# Patient Record
Sex: Female | Born: 1950 | Race: White | Hispanic: No | Smoking: Never smoker
Health system: Southern US, Community
[De-identification: ages and names within clinical notes are randomized; demographics above are authoritative.]

## PROBLEM LIST (undated history)

## (undated) DIAGNOSIS — E079 Disorder of thyroid, unspecified: Secondary | ICD-10-CM

## (undated) DIAGNOSIS — F32A Depression, unspecified: Secondary | ICD-10-CM

## (undated) DIAGNOSIS — I10 Essential (primary) hypertension: Secondary | ICD-10-CM

## (undated) DIAGNOSIS — F329 Major depressive disorder, single episode, unspecified: Secondary | ICD-10-CM

## (undated) DIAGNOSIS — M1990 Unspecified osteoarthritis, unspecified site: Secondary | ICD-10-CM

## (undated) HISTORY — PX: KNEE SURGERY: SHX244

## (undated) HISTORY — PX: OTHER SURGICAL HISTORY: SHX169

---

## 2003-08-08 ENCOUNTER — Encounter: Payer: Self-pay | Admitting: Family Medicine

## 2003-08-08 ENCOUNTER — Ambulatory Visit (HOSPITAL_COMMUNITY): Admission: RE | Admit: 2003-08-08 | Discharge: 2003-08-08 | Payer: Self-pay | Admitting: Family Medicine

## 2003-12-14 ENCOUNTER — Ambulatory Visit (HOSPITAL_BASED_OUTPATIENT_CLINIC_OR_DEPARTMENT_OTHER): Admission: RE | Admit: 2003-12-14 | Discharge: 2003-12-14 | Payer: Self-pay | Admitting: Orthopedic Surgery

## 2003-12-14 ENCOUNTER — Ambulatory Visit (HOSPITAL_COMMUNITY): Admission: RE | Admit: 2003-12-14 | Discharge: 2003-12-14 | Payer: Self-pay | Admitting: Orthopedic Surgery

## 2005-01-30 ENCOUNTER — Ambulatory Visit: Payer: Self-pay | Admitting: Internal Medicine

## 2005-01-30 ENCOUNTER — Observation Stay (HOSPITAL_COMMUNITY): Admission: AD | Admit: 2005-01-30 | Discharge: 2005-01-31 | Payer: Self-pay | Admitting: Family Medicine

## 2005-02-03 ENCOUNTER — Inpatient Hospital Stay (HOSPITAL_COMMUNITY): Admission: EM | Admit: 2005-02-03 | Discharge: 2005-02-05 | Payer: Self-pay | Admitting: Emergency Medicine

## 2010-04-03 ENCOUNTER — Ambulatory Visit (HOSPITAL_COMMUNITY): Admission: RE | Admit: 2010-04-03 | Discharge: 2010-04-03 | Payer: Self-pay | Admitting: Family Medicine

## 2010-04-09 ENCOUNTER — Ambulatory Visit (HOSPITAL_COMMUNITY): Admission: RE | Admit: 2010-04-09 | Discharge: 2010-04-09 | Payer: Self-pay | Admitting: Family Medicine

## 2011-04-12 NOTE — Discharge Summary (Signed)
Latoya [name], [name]                ACCOUNT NO.:  [id number]   MEDICAL RECORD NO.:  [id number]          PATIENT TYPE:  INP   LOCATION:  A214                          FACILITY:  [facility]   PHYSICIAN:  Patrica Duel, M.D.    DATE OF BIRTH:  04/16/1951   DATE OF ADMISSION:  02/03/2005  DATE OF DISCHARGE:  03/14/2006LH                                 DISCHARGE SUMMARY   DISCHARGE DIAGNOSES:  1.  Recurrent hematochezia secondary to diverticular bleed (Indocin induced)      with secondary acute blood loss anemia.  2.  Hypothyroidism.  3.  Osteoarthritis.  4.  Gastroesophageal reflux disease.   For details regarding admission, please refer to the admitting note.  Briefly, this 60 year old female had been discharged from this facility  approximately 2 days prior to being readmitted with GI bleeding.  She did  have an episode of hematochezia.  No etiology was determined, and a  colonoscopy was declined.  She developed a cramping sensation and  hematochezia of a moderate amount at approximately 2 p.m. on the day of  admission.  She had approximately two to three before presenting to the  emergency department, where she had a fourth bloody stool.  Her initial  hemodynamics were reasonable, although she was mildly tachycardic with a  heart rate of 110.  She also had mild orthostasis.  Hemoglobin was 8.3 with  an MCV of 83 with a normal platelet count.   COURSE IN THE HOSPITAL:  The patient was admitted to 2-A.  She was typed and  crossed for 2 units of packed cells and remained stable.  A colonoscopy was  performed by Dr. Karilyn Cota which revealed the presence of a diverticular ulcer  with an ulcerated diverticulum.  She also had pancolonic diverticulosis.  Dr. Karilyn Cota felt this was most likely from Indocin (this had been  discontinued from her prior admission).   The patient remained stable and was discharged the following day in good  condition.   DISPOSITION AND MEDICATIONS:  Iron tablets,  Synthroid 175 daily, Protonix 40  daily, Cipro 500 b.i.d. 10 days, metronidazole 500 b.i.d. for 10 days.  She  will be followed and treated expectantly as an outpatient.      MC/MEDQ  D:  02/28/2005  T:  02/28/2005  Job:  [id number]

## 2011-04-12 NOTE — Consult Note (Signed)
NAMEWILMOTH, RASNIC                ACCOUNT NO.:  [id number]   MEDICAL RECORD NO.:  [id number]          PATIENT TYPE:  INP   LOCATION:  A214                          FACILITY:  [facility]   PHYSICIAN:  Stana Bunting, M.D.  DATE OF BIRTH:  [birth year]   DATE OF CONSULTATION:  DATE OF DISCHARGE:                                   CONSULTATION   REASON FOR CONSULTATION:  Further evaluation of hematochezia.   HISTORY OF PRESENT ILLNESS:  Ms. Rumpf is a 60 year old white female with a  past medical history significant for hypothyroidism, as well as  osteoarthritis, mainly involving her knees, as well as vitiligo.  She  recently was hospitalized here earlier this week for hematochezia.  During  the course of that hospitalization she was found to be anemic and did  undergo an upper endoscopy.  This was done after she was seen in  consultation by Dr. Karilyn Cota.  He performed an upper endoscopy on January 31, 2005, which showed the esophagus and stomach to be normal.  Likewise, the  duodenum was normal.  No lesion was seen to account for her GI bleeding.  Of  note, there was concern related to the fact that she had been taking Indocin  for several months for her osteoarthritis.  A colonoscopy was planned for  the next day but Ms. Corrie decided to leave against medical advice.  Of  note, during the course of that hospitalization she says she did not receive  any blood transfusions.   Ms. Brenes returns to the emergency department today noting that over the  course of this afternoon she has had three-four bowel movements containing  mainly clots, as well as bright red blood.  She denies any frank melena and  denies any abdominal pain.  Since her discharge until today she had not had  any bleeding whatsoever.  She has not had any abdominal pain, nausea and  vomiting.  She has been eating without any difficulty and denies any  dysphasia or reflux symptoms.  She has no longer been taking any NSAIDS.  Late  this afternoon she began feeling weak and subsequently came in for  further evaluation.  Of note, she says this is similar to the way she  presented last week.  Upon arrival to the emergency department the patient's  blood pressure is stable but she is slightly tachycardia at around 110.  She  denies any chest pain, shortness of breath, or syncope.  However, she does  have URI-type symptoms.   Over the course of evaluating Ms. Brentlinger I have reviewed the electronic  medical records here, including Dr. Patty Sermons note from earlier this week.   PAST MEDICAL HISTORY:  1.  Hypothyroidism.  2.  Osteoarthritis mainly involving her knees.  3.  Vitiligo.   PAST SURGICAL HISTORY:  None.   CURRENT MEDICATIONS:  1.  The patient notes she is taking Synthroid.  2.  She reports being off of indomethacin since her recent GI bleed.   ALLERGIES:  SULFA.   SOCIAL HISTORY:  The patient lives here in [place].  She is separated and  has two grown children.  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  She does not use alcohol or tobacco.   FAMILY HISTORY:  Negative for colon cancer or liver disease.  She does  report a family history of diabetes.   REVIEW OF SYSTEMS:  A complete review of systems was performed and is  negative except as noted in the history of present illness.   PHYSICAL EXAMINATION:  VITAL SIGNS:  Pulse 101, blood pressure 118/67,  respirations 22.  She is saturating 97% on room air.  GENERAL:  An obese white female who is alert and oriented.  She is nontoxic.  HEENT:  Atraumatic normocephalic.  Sclerae anicteric.  NECK:  Supple.  CARDIOVASCULAR:  Regular rate and rhythm.  CHEST:  Clear to auscultation bilaterally.  ABDOMEN:  Obese.  There is no guarding or rebound.  I do not appreciate any  masses or organomegaly.  She has positive bowel sounds.  EXTREMITIES:  Warm.  No edema.  NEUROLOGIC:  The patient is alert and oriented and has appropriate affect.    LABORATORY DATA:  Labs checked in the emergency department today show the  patient to have a PT of 12.6 with an INR of 1.9.  Her hemoglobin today is  8.3 with an MCV of 83.  Her platelet count is 297.  Her white count is 5.6.  A BMP shows a sodium of 138, a potassium slightly low at 3.3, CO2 is 27, BUN  12, creatinine 1.0.  Of note, for comparison when she left on January 31, 2005  her hemoglobin was 9.6.  On January 30, 2005 it was 9.9.  Endoscopic data is  reviewed in the history of present illness.   ASSESSMENT:  Ms. Bies is a 60 year old white female with a recent  gastrointestinal bleed.  She returns today with recurrent hematochezia.  She  has been investigated to date with an upper endoscopy, which did not show  any source of bleeding.  I suspect on the history that she most likely has a  lower gastrointestinal bleed, possibly a diverticular bleed.  Another  diagnostic consideration could be a possible small bowel lesion related to  her NSAID use.  Likewise, NSAID colonic injury could also cause her  bleeding.  At this point she is somewhat tachycardiac and has been typed and  crossed and will receive two units of packed red blood cells.  We will  follow her closely and begin to prep her for a colonoscopy in the morning.  I have discussed this with Ms. Longsworth and she understands the risks and  benefits of proceeding with this and wishes to proceed.  She will be placed  on a proton pump inhibitor as well.      RSS/MEDQ  D:  02/03/2005  T:  02/04/2005  Job:  [id number]

## 2011-04-12 NOTE — H&P (Signed)
NAMESINCERITY, CEDAR                ACCOUNT NO.:  [id number]   MEDICAL RECORD NO.:  [id number]          PATIENT TYPE:  INP   LOCATION:  A207                          FACILITY:  [facility]   PHYSICIAN:  Kirk Ruths, M.D.DATE OF BIRTH:  1951-11-01   DATE OF ADMISSION:  DATE OF DISCHARGE:  LH                                HISTORY & PHYSICAL   CHIEF COMPLAINT:  Rectal bleeding for three days.   HISTORY OF PRESENT ILLNESS:  Mrs. Latoya [name] presented to our office this  morning complaining of three days of rectal bleeding.  She describes this as  mixed with bright red and sometimes dark.  She has had more gas and when she  expels the gas, it is nothing but blood in the toilet. This has also been  associated with more weakness and palpitations and her heart racing  especially when she stands up.  She has been on Indocin off and on for about  six weeks for arthritic pains.  She stopped this three days ago when the  bleeding began.  She denies any abdominal pain.  No nausea, vomiting,  constipation or diarrhea is associated with this.  On review of systems she  denies any fever, but does complain of generalized weakness.  No headaches,  but some mild dizziness, especially when she stands quickly. No chest pain.  Positive palpitations as described above.  No shortness of breath.  No  urinary tract symptoms are reported.   PAST MEDICAL HISTORY:  1.  Positive for hypothyroidism.  2.  Depression.  3.  Hypertension.  Currently on no medication.  4.  History of arthritis of the knees bilaterally for which she consults      with an orthopedist.   FAMILY HISTORY:  On her mother's side, she does have some diabetes.  Heart  disease in her father.  She has one brother who also has thyroid problems  and one sister who is apparently well.  She has two children who are in good  health.   SOCIAL HISTORY:  She denies smoking, alcohol use or drug use.  She works at  [employer].   CURRENT  MEDICATIONS:  1.  Synthroid 175 mcg p.o. q.a.m.  2.  Indocin 25 gm t.i.d. p.r.n. up until January 28, 2005 at which time the      bleeding began.   ALLERGIES:  She is allergic to sulfa.   PHYSICAL EXAMINATION:  VITAL SIGNS:  BP 140/80, weight 218.  GENERAL:  She does not look severely ill and is not terribly pale.  HEENT:  PERRLA.  EOMI.  No icterus.  Conjunctivae appear normal.  NECK:  Supple without adenopathy.  HEART:  Regular rate and rhythm without murmur, gallop or rub.  LUNGS:  Clear to auscultation without crackles, wheezes, rales, or rhonchi.  ABDOMEN:  Bowel sounds are positive.  Soft, nontender.  No masses or  organomegaly are palpable.  EXTREMITIES:  No edema, cyanosis, or clubbing.  SKIN:  Without any visible rash, ecchymosis or petechiae.  RECTAL:  She has dried blood around the anus and normal tone  of the anal  sphincter.  She does have obvious jelly-like blood present on rectal  examination with positive guaiac.   ASSESSMENT:  1.  Rectal bleeding.  2.  Palpitations.  3.  Hypothyroidism.  4.  History of hypertension, currently on no medications.  5.  Osteoarthritis.  6.  History of depression.  7.  Recent nonsteroidal anti-inflammatory drug use for the arthritis.   PLAN:  1.  She is admitted to Dr. Regino Schultze for IV fluids and supportive care.  She      will be consulted by GI as soon as possible for work-up for the GI      bleed.  2.  Continue all current home medications.      WMM/MEDQ  D:  01/30/2005  T:  01/30/2005  Job:  [id number]

## 2011-04-12 NOTE — Op Note (Signed)
[name], COLLAR                ACCOUNT NO.:  [id number]   MEDICAL RECORD NO.:  [id number]          PATIENT TYPE:  INP   LOCATION:  A207                          FACILITY:  [facility]   PHYSICIAN:  Lionel December, M.D.    DATE OF BIRTH:  [birth year]   DATE OF PROCEDURE:  01/31/2005  DATE OF DISCHARGE:                                 OPERATIVE REPORT   PROCEDURES:  Esophagogastroduodenoscopy.   INDICATIONS:  Latoya [name] is a 60 year old Caucasian female who presents with GI  bleed.  She has been on chronic NSAID therapy for the last six weeks or so.  She has been on indomethacin.  The procedure risks were reviewed with the  patient.  Informed consent was obtained.   PREOPERATIVE MEDICATIONS:  1.  Cetacaine for pharyngeal topical anesthesia.  2.  Demerol 50 mg IV.  3.  Versed 13 mg IV.   FINDINGS:  Procedure performed in endoscopy suite.  The patient's vital  signs and O2 saturation were monitored during the procedure and remained  stable.  The patient was placed in the left lateral decubitus position.  The  Olympus video scope was passed through the oropharynx without any difficulty  into the esophagus.   Esophagus:  The mucosa of the esophagus was normal throughout.  The GE  junction was at 36 cm and the hiatus was at 38 cm. She had a small sliding  hiatal hernia.   Stomach:  It was empty and distended very well with insufflation.  The folds  in the proximal stomach were normal.  Examination of the mucosa was also  normal at the body, antrum, pyloric channel, as well as angularis, fundus  and cardia.   Duodenal bulbar mucosa was normal.  The scope was passed into the second  part of the duodenum where mucosa and folds were normal.  The endoscope was  withdrawn.  The patient tolerated the procedure well.   FINAL DIAGNOSIS:  Small sliding hiatal hernia, otherwise normal  esophagogastroduodenoscopy.  No lesion found to account for GI bleed.   RECOMMENDATIONS:  Total colonoscopy in the  a.m.  She will be prepped with  GoLYTELY this afternoon.  H&H will be repeated in the a.m.  We will also get  a baseline PT and PTT.  If she has evidence of bleeding today, would  consider checking her H&H this afternoon.      NR/MEDQ  D:  01/31/2005  T:  01/31/2005  Job:  [id number]   cc:   Kirk Ruths, M.D.  [address]  [place]  [place] [address]  Fax: [phone]

## 2011-04-12 NOTE — Consult Note (Signed)
NAMEHEDDA, Latoya [name]                ACCOUNT NO.:  [id number]   MEDICAL RECORD NO.:  [id number]          PATIENT TYPE:  INP   LOCATION:  A207                          FACILITY:  [facility]   PHYSICIAN:  Lionel December, M.D.    DATE OF BIRTH:  October 01, 1951   DATE OF CONSULTATION:  01/30/2005  DATE OF DISCHARGE:                                   CONSULTATION   REFERRING PHYSICIAN:  Kirk Ruths, M.D.   REASON FOR CONSULTATION:  Gastrointestinal bleed.   HISTORY OF PRESENT ILLNESS:  The patient is a 60 year old Caucasian female  who presents with a three-day history of hematochezia.  Last Monday, she  developed several episodes of dark blood with fresh blood and clot per  rectum.  She had a lot of gas with this.  She denies any actual stools  noted.  She did had a bowel movement, but had nothing but blood.  Denies any  abdominal pain, nausea, vomiting, constipation, diarrhea or heartburn.  She  has been on Indocin for about six weeks.  She has had some dizziness and  weakness, especially with standing.  She was a direct admit per Dr.  Edison Simon office today.  On admission, her hemoglobin was 9.9, hematocrit  28.4 and MCV 83.5.  Her BUN was 11 and creatinine 0.6.  She has had at least  seven episodes of bleeding.  She has not had any further bleeding since  admission.  On rectal examination at Dr. Edison Simon office, she had jelly-  like material on the glove.  She had dried blood around the anus.   MEDICATIONS PRIOR TO ADMISSION:  1.  Indocin 25 mg three to four times daily as needed for arthritic pain.      She averaged taking three to four daily, but most recently only one to      two daily.  She has been on the medicine for six weeks.  2.  Synthroid 175 mcg daily.  3.  Darvocet a half to one tablet p.r.n.   ALLERGIES:  SULFA causes rash.   PAST MEDICAL HISTORY:  1.  Hypothyroidism.  2.  Depression.  3.  Bilateral knee arthritis.  4.  Degenerative joint disease, status post  bilateral knee arthroscopies      previously.  5.  History of hypertension several years ago and has not been on any      medicine recently.   FAMILY HISTORY:  Negative for colorectal cancer, chronic GI illnesses or  inflammatory bowel disease.   SOCIAL HISTORY:  She is married, but has been separated for three years.  She has two children.  She has worked part time with housekeeping at  [employer].  Denies any tobacco, alcohol or drug use.   REVIEW OF SYSTEMS:  GASTROINTESTINAL:  See HPI.  CONSTITUTIONAL:  Denies  weight loss.  CARDIOPULMONARY:  Denies any chest pain or shortness of  breath.   PHYSICAL EXAMINATION:  VITAL SIGNS:  Temperature maximum 100.3 degrees,  current temperature 98.8 degrees, pulse 74, respirations 20, blood pressure  116/71.  WEIGHT:  211 pounds.  HEIGHT:  65  inches.  GENERAL APPEARANCE:  A pleasant, obese, Caucasian female in no acute  distress.  SKIN:  Warm and dry.  No jaundice.  HEENT:  The conjunctivae are pink.  The sclerae are nonicteric.  Oropharyngeal mucosa moist and pink.  No lesions, erythema or exudate.  NECK:  No lymphadenopathy or thyromegaly.  CHEST:  Lungs are clear to auscultation.  CARDIAC:  Regular rate and rhythm.  Normal S1 and S2.  No murmurs, rubs or  gallops.  ABDOMEN:  Positive bowel sounds.  Soft, nontender and nondistended.  No  organomegaly or masses.  EXTREMITIES:  No edema.   LABORATORIES:  As mentioned in the HPI.  In addition, white count 8700,  platelets 245,000 and MCV 83.3.  Sodium 136, potassium 3.6, BUN 11,  creatinine 0.6, glucose 101.  Total bilirubin 0.4, alkaline phosphatase 88,  AST 18, ALT 19, albumin 3.7.   IMPRESSION:  The patient is a 60 year old lady who presents with acute onset  of hematochezia with anemia in the setting of a six-week history of Indocin  use.  At this time, cannot rule out rapid transit upper GI bleed secondary  to peptic ulcer disease from NSAID use, however, she is  hemodynamically  stable.  Another possibility includes colitis related to Indocin use.   RECOMMENDATIONS:  1.  Will initially proceed with EGD in the morning.  If this is negative,      she will need to have a diagnostic colonoscopy.  If she does have an      ulcer, then she will at a minimum need to have a screening colonoscopy      at a later date.  Will follow up H&H in the morning.  Will begin IV      Protonix 40 mg daily, first dose now.  2.  Further recommendations to follow.      LL/MEDQ  D:  01/30/2005  T:  01/30/2005  Job:  [id number]

## 2011-04-12 NOTE — Op Note (Signed)
NAME:  Latoya [name], Latoya [name]                          ACCOUNT NO.:  [id number]   MEDICAL RECORD NO.:  [id number]                   PATIENT TYPE:  AMB   LOCATION:  DSC                                  FACILITY:  [facility]   PHYSICIAN:  Harvie Junior, M.D.                DATE OF BIRTH:  1950/12/23   DATE OF PROCEDURE:  12/14/2003  DATE OF DISCHARGE:                                 OPERATIVE REPORT   PREOPERATIVE DIAGNOSIS:  Bilateral degenerative joint disease with bilateral  medial meniscal tears.   POSTOPERATIVE DIAGNOSES:  1. Bilateral degenerative joint disease with bilateral medial meniscal     tears.  2. Chondromalacia of the patella bilaterally.   OPERATION PERFORMED:  1. Right knee arthroscopy with debridement of posterior horn medial     meniscus, debridement of chondromalacia in the medial compartment,     debridement of medial shelf plica.  2. Debridement of chondromalacia in the patellofemoral joint.  3. Left knee arthroscopy with debridement of medial meniscal tear,     debridement of chondromalacia in the medial compartment and debridement     of medial shelf plica.  4. Debridement of chondromalacia patella of the left knee.   SURGEON:  Harvie Junior, M.D.   ASSISTANT:  Marshia Ly, P.A.   ANESTHESIA:  General.   INDICATIONS FOR PROCEDURE:  Ms. Suchocki is a 60 year old female with a long  history of having significant bilateral degenerative joint disease.  She  ultimately is evaluated in the office and felt to have medial meniscal  tearing.  We tried injection therapy which worked initially but she began  having intermittent catching, locking, grabbing pain.  Because of this she  was brought to the operating room for operative knee arthroscopy bilateral.   DESCRIPTION OF PROCEDURE:  The patient was brought to the operating room and  after adequate anesthesia was obtained with a general anesthetic, the  patient was placed supine on the operating table.  The left leg was  prepped  and draped in the usual sterile fashion.  Following this, routine  arthroscopic examination of the right knee after prepping and draping both  legs.  Attention was turned to the right knee.  Routine arthroscopic  examination of the right knee revealed there was a posterior horn medial  meniscal tear.  This was debrided back to a smooth and stable rim with a  straight biting forceps, upbiting forceps and left hand side biting forceps.  A suction shaver was used to contour down the remaining meniscal rim.  Attention was then turned towards the medial femoral condyle where there was  a 2 x 2 cm area of exposed bone.  There was some shouldering lesions, but  not dramatic.  Attention was then turned to the tibial plateau where there  was again some shouldering lesions.  Attention was turned to the lateral  side which had slight chondromalacia but not dramatic.  Attention was turned  up to the patellofemoral compartment where there were some grade 2 changes  in the patellofemoral joint.  This was debrided.  A large medial shelf plica  was identified at this point and this was debrided with a suction shaver.  Six liters of normal saline irrigation was instilled in the knee for  flushing and irrigation of the knee and once this was completed, the patient  had the knee irrigated and suctioned dry.  Attention was then turned towards  the left knee.  The left knee underwent routine arthroscopic examination at  this point and was found to have significant posterior horn medial meniscal  tear.  This was debrided with a straight biting forceps, up biting forceps.  There was a 2 cm area of exposed bone in the left knee and this was debrided  to a smooth and stable rim.  There were some shouldering lesions of  articular cartilage on this left knee which were debrided.  Attention was  turned to the tibial plateau which had a slight area of grade 2 change and  this again was debrided.  Attention was  turned toward the lateral  compartment where the chondromalacia of the lateral compartment was  identified and minimally debrided.  The anterior cruciate was identified and  debrided.  The patellofemoral compartment was identified at this point and  this was debrided with a suction shaver back to a smooth and stable rim.  At  this point the knee was suctioned dry and 6 L of normal saline irrigation  was filled in the knee first and this was suctioned dry and the knee was  closed with a bandage.  The portals were closed with a bandage.  Sterile  compressive dressing was applied to both knees.  The patient was then  transferred to the recovery room where she was noted to be in satisfactory  condition.  The estimated blood loss for this procedure was none.                                               Harvie Junior, M.D.    Ranae Plumber  D:  12/14/2003  T:  12/15/2003  Job:  [id number]

## 2011-04-12 NOTE — Op Note (Signed)
Latoya [name], Latoya [name]                ACCOUNT NO.:  [id number]   MEDICAL RECORD NO.:  [id number]          PATIENT TYPE:  INP   LOCATION:  A214                          FACILITY:  [facility]   PHYSICIAN:  Lionel December, M.D.    DATE OF BIRTH:  1951-06-22   DATE OF PROCEDURE:  02/04/2005  DATE OF DISCHARGE:                                 OPERATIVE REPORT   PROCEDURE:  Total colonoscopy.   INDICATION:  Vivion is a 60 year old Caucasian female who was admitted last  week with GI bleed and anemia.  She had been on indomethacin.  She had  normal esophagogastroduodenoscopy performed on January 31, 2005.  The patient  was scheduled to undergo colonoscopy.  The following morning, however, she  left AMA.  Yesterday afternoon she began to pass bright red blood per rectum  and therefore was rehospitalized and is agreeable to further workup.   On admission her hemoglobin is 8.3 grams.  She has received two units of  PRBC and is up to 10.8 g.  The procedure risks were reviewed the patient,  informed consent was obtained.   PREMEDICATION:  Demerol 50 mg IV, Versed 6 mg IV in divided dose.   FINDINGS:  Procedure performed in endoscopy suite.  The patient's vital  signs and O2 saturation were monitored during procedure and remained stable.  The patient was placed in the left lateral recumbent position and rectal  examination performed.  No abnormality noted on external or digital exam.  The Olympus video scope was placed in the rectum and advanced under vision  in the sigmoid colon.  Preparation was  excellent.  There is no fresh or old  blood noted.  There was a single ulcerated diverticulum at sigmoid colon  without any active bleeding.  A few more scattered diverticula noted  throughout the colon, but none of these had stigmata of bleeding or  ulceration.  The scope was passed to the cecum, which was identified by  appendiceal orifice and ileocecal valve.  Pictures taken for the record.  Colonic mucosa was  examined for the second time on the way out without  additional findings.  Rectal mucosa was normal.  The scope was retroflexed  to examine anorectal junction, which was unremarkable.  The endoscope was  then withdrawn.  The patient tolerated the procedure well.   FINAL DIAGNOSES:  1.  Sigmoid diverticular bleed.  Single ulcerated diverticulum.  2.  Pancolonic diverticulosis. She has a few small scattered diverticula      throughout the colon.   RECOMMENDATIONS:  1.  Full liquids today and regular diet in a.m.  2.  A bulk-forming agent can be at the time of discharge.  3.  She will have H&H name.  4.  She should not take indomethacin any more.  If she has to go back on      NSAID, she could do so after two weeks.      NR/MEDQ  D:  02/04/2005  T:  02/04/2005  Job:  [id number]   cc:   Patrica Duel, M.D.  [address]  [place] [address]  Fax: [phone]

## 2011-04-12 NOTE — H&P (Signed)
[name], [name]                ACCOUNT NO.:  [id number]   MEDICAL RECORD NO.:  [id number]          PATIENT TYPE:  INP   LOCATION:  A214                          FACILITY:  [facility]   PHYSICIAN:  Patrica Duel, M.D.    DATE OF BIRTH:  28-Sep-1951   DATE OF ADMISSION:  02/03/2005  DATE OF DISCHARGE:  [facility]                                HISTORY & PHYSICAL   CHIEF COMPLAINT:  Rectal bleeding.   HISTORY OF PRESENT ILLNESS:  This is a 60 year old female discharged from  this facility approximately two days ago after having been admitted for GI  bleeding.  Apparently no etiology was determined, and she declined  colonoscopy.  Her last documented hemoglobin was approximately 9.6 on 7  March.  She has had no problems since discharge until approximately 2 p.m.  when she had a cramping sensation and hematochezia of a moderate amount. She  had two to three before presenting to the emergency department where she had  a fourth.  Her initial hemodynamics were reasonable, though she was mildly  tachycardic with a heart rate of 110.  There was mild orthostasis with  increased heart rate to 122, though blood pressure remains relatively stable  upon standing.   The patient had previously been on Indocin, though this has been  discontinued per her.  She has a history of hypothyroidism which has been  well compensated.   There is no history of nausea or vomiting, hematemesis, fever, chills, chest  pain, shortness of breath, or neurologic deficits.   The patient is admitted for recurrent apparent lower GI bleeding.   MEDICATIONS:  1.  Synthroid 175 mcg daily.  2.  Protonix 40 mg daily.   ALLERGIES:  SULFA.   PAST MEDICAL HISTORY:  As noted above.   FAMILY HISTORY:  No history of colorectal cancer documented.   REVIEW OF SYSTEMS:  Negative except as mentioned.   SOCIAL HISTORY:  Nonsmoker, nondrinker.   PHYSICAL EXAMINATION:  GENERAL:  Very pleasant female who appears her stated  age of 61.  VITAL SIGNS:  Currently blood pressure 127/70, heart rate 80 and regular,  respirations 16 and unlabored.  HEENT:  Mild pallor.  Normocephalic and atraumatic.  Pupils equal.  Ears,  nose, throat benign.  NECK:  Supple with no bruits or thyromegaly or lymphadenopathy.  LUNGS:  Clear.  CARDIOVASCULAR:  Heart sounds normal without murmurs, rubs, or gallops.  ABDOMEN:  Nontender, nondistended.  Bowel sounds intact.  RECTAL:  Per ED physician is strongly guaiac positive without masses.  EXTREMITIES:  No clubbing, cyanosis, or edema.  NEUROLOGIC:  Exam is nonfocal.   LABORATORY DATA AND OTHER STUDIES:  Pertinent labs as noted.  Also relevant  is normal pro time and platelet count.  Her potassium is 3.3.   ASSESSMENT:  Recurrent gastrointestinal bleeding in 60 year old female with  above history.  Most likely represents lower gastrointestinal bleed as it  has been bright red without clots on this occurrence.  Further diagnostics  definitely indicated to elucidate source of the bleeding.  Currently,  hemodynamics are stable.  She reports one  very small stool since arrival to  the floor.   PLAN:  1.  GI consult.  2.  Will inform surgery of the patient's predicament should acute      intervention be necessary.  3.  Will type, cross, and transfuse 2 units and have 2 additional units on      hold in the lab.  4.  She will be followed and treated expectantly.      MC/MEDQ  D:  02/03/2005  T:  02/03/2005  Job:  [id number]

## 2014-09-13 ENCOUNTER — Other Ambulatory Visit (HOSPITAL_COMMUNITY): Payer: Self-pay | Admitting: Physician Assistant

## 2014-09-13 ENCOUNTER — Ambulatory Visit (HOSPITAL_COMMUNITY)
Admission: RE | Admit: 2014-09-13 | Discharge: 2014-09-13 | Disposition: A | Discharge status: Home / Self Care | Payer: Medicare Other | Source: Ambulatory Visit | Attending: Physician Assistant | Admitting: Physician Assistant

## 2014-09-13 DIAGNOSIS — R05 Cough: Secondary | ICD-10-CM | POA: Diagnosis present

## 2014-09-13 DIAGNOSIS — R0602 Shortness of breath: Secondary | ICD-10-CM

## 2014-10-25 ENCOUNTER — Other Ambulatory Visit (HOSPITAL_COMMUNITY): Payer: Self-pay | Admitting: Physician Assistant

## 2014-10-25 ENCOUNTER — Ambulatory Visit (HOSPITAL_COMMUNITY)
Admission: RE | Admit: 2014-10-25 | Discharge: 2014-10-25 | Disposition: A | Discharge status: Home / Self Care | Payer: Medicare Other | Source: Ambulatory Visit | Attending: Physician Assistant | Admitting: Physician Assistant

## 2014-10-25 DIAGNOSIS — J9811 Atelectasis: Secondary | ICD-10-CM | POA: Diagnosis not present

## 2014-10-25 DIAGNOSIS — R0602 Shortness of breath: Secondary | ICD-10-CM

## 2014-10-25 DIAGNOSIS — Q791 Other congenital malformations of diaphragm: Secondary | ICD-10-CM | POA: Insufficient documentation

## 2014-12-08 ENCOUNTER — Other Ambulatory Visit (HOSPITAL_COMMUNITY): Payer: Self-pay | Admitting: Respiratory Therapy

## 2014-12-08 DIAGNOSIS — R0602 Shortness of breath: Secondary | ICD-10-CM

## 2014-12-12 ENCOUNTER — Ambulatory Visit (HOSPITAL_COMMUNITY)
Admission: RE | Admit: 2014-12-12 | Discharge: 2014-12-12 | Disposition: A | Discharge status: Home / Self Care | Payer: Medicare Other | Source: Ambulatory Visit | Attending: Pulmonary Disease | Admitting: Pulmonary Disease

## 2014-12-12 DIAGNOSIS — I517 Cardiomegaly: Secondary | ICD-10-CM

## 2014-12-12 DIAGNOSIS — R0600 Dyspnea, unspecified: Secondary | ICD-10-CM | POA: Insufficient documentation

## 2014-12-12 NOTE — Progress Notes (Signed)
  Echocardiogram 2D Echocardiogram has been performed.  Latoya [name] 12/12/2014, 2:39 PM

## 2014-12-16 ENCOUNTER — Encounter (HOSPITAL_COMMUNITY): Payer: Medicare Other

## 2014-12-23 ENCOUNTER — Ambulatory Visit (HOSPITAL_COMMUNITY)
Admission: RE | Admit: 2014-12-23 | Discharge: 2014-12-23 | Disposition: A | Discharge status: Home / Self Care | Payer: Medicare Other | Source: Ambulatory Visit | Attending: Pulmonary Disease | Admitting: Pulmonary Disease

## 2014-12-23 DIAGNOSIS — R0602 Shortness of breath: Secondary | ICD-10-CM | POA: Diagnosis present

## 2014-12-23 LAB — BLOOD GAS, ARTERIAL
Acid-Base Excess: 1.8 mmol/L (ref 0.0–2.0)
BICARBONATE: 25.7 meq/L — AB (ref 20.0–24.0)
DRAWN BY: 25955
O2 SAT: 93.6 %
PH ART: 7.432 (ref 7.350–7.450)
PO2 ART: 76.6 mmHg — AB (ref 80.0–100.0)
Patient temperature: 37
TCO2: 22.5 mmol/L (ref 0–100)
pCO2 arterial: 39.2 mmHg (ref 35.0–45.0)

## 2014-12-23 MED ORDER — ALBUTEROL SULFATE (2.5 MG/3ML) 0.083% IN NEBU
2.5000 mg | INHALATION_SOLUTION | Freq: Once | RESPIRATORY_TRACT | Status: AC
  Administered 2014-12-23: 2.5 mg via RESPIRATORY_TRACT

## 2014-12-27 LAB — PULMONARY FUNCTION TEST
DL/VA % PRED: 150 %
DL/VA: 7.41 ml/min/mmHg/L
DLCO unc % pred: 77 %
DLCO unc: 19.75 ml/min/mmHg
FEF 25-75 PRE: 1.28 L/s
FEF 25-75 Post: 1.49 L/sec
FEF2575-%CHANGE-POST: 15 %
FEF2575-%PRED-POST: 64 %
FEF2575-%PRED-PRE: 55 %
FEV1-%CHANGE-POST: 2 %
FEV1-%Pred-Post: 59 %
FEV1-%Pred-Pre: 57 %
FEV1-PRE: 1.5 L
FEV1-Post: 1.54 L
FEV1FVC-%Change-Post: 1 %
FEV1FVC-%Pred-Pre: 101 %
FEV6-%Change-Post: 1 %
FEV6-%PRED-POST: 58 %
FEV6-%Pred-Pre: 58 %
FEV6-Post: 1.91 L
FEV6-Pre: 1.89 L
FEV6FVC-%Pred-Post: 104 %
FEV6FVC-%Pred-Pre: 104 %
FVC-%Change-Post: 1 %
FVC-%PRED-POST: 56 %
FVC-%Pred-Pre: 55 %
FVC-POST: 1.91 L
FVC-Pre: 1.89 L
POST FEV1/FVC RATIO: 81 %
Post FEV6/FVC ratio: 100 %
Pre FEV1/FVC ratio: 79 %
Pre FEV6/FVC Ratio: 100 %
RV % PRED: 119 %
RV: 2.49 L
TLC % pred: 78 %
TLC: 4.07 L

## 2014-12-28 IMAGING — CR DG CHEST 2V
2 series · 2 of 2 positions shown · non-contrast
Comparison: 09/13/2014

CLINICAL DATA: Short of breath for 6 weeks

EXAM:
CHEST  2 VIEW

[view not recorded (1 of 2)]
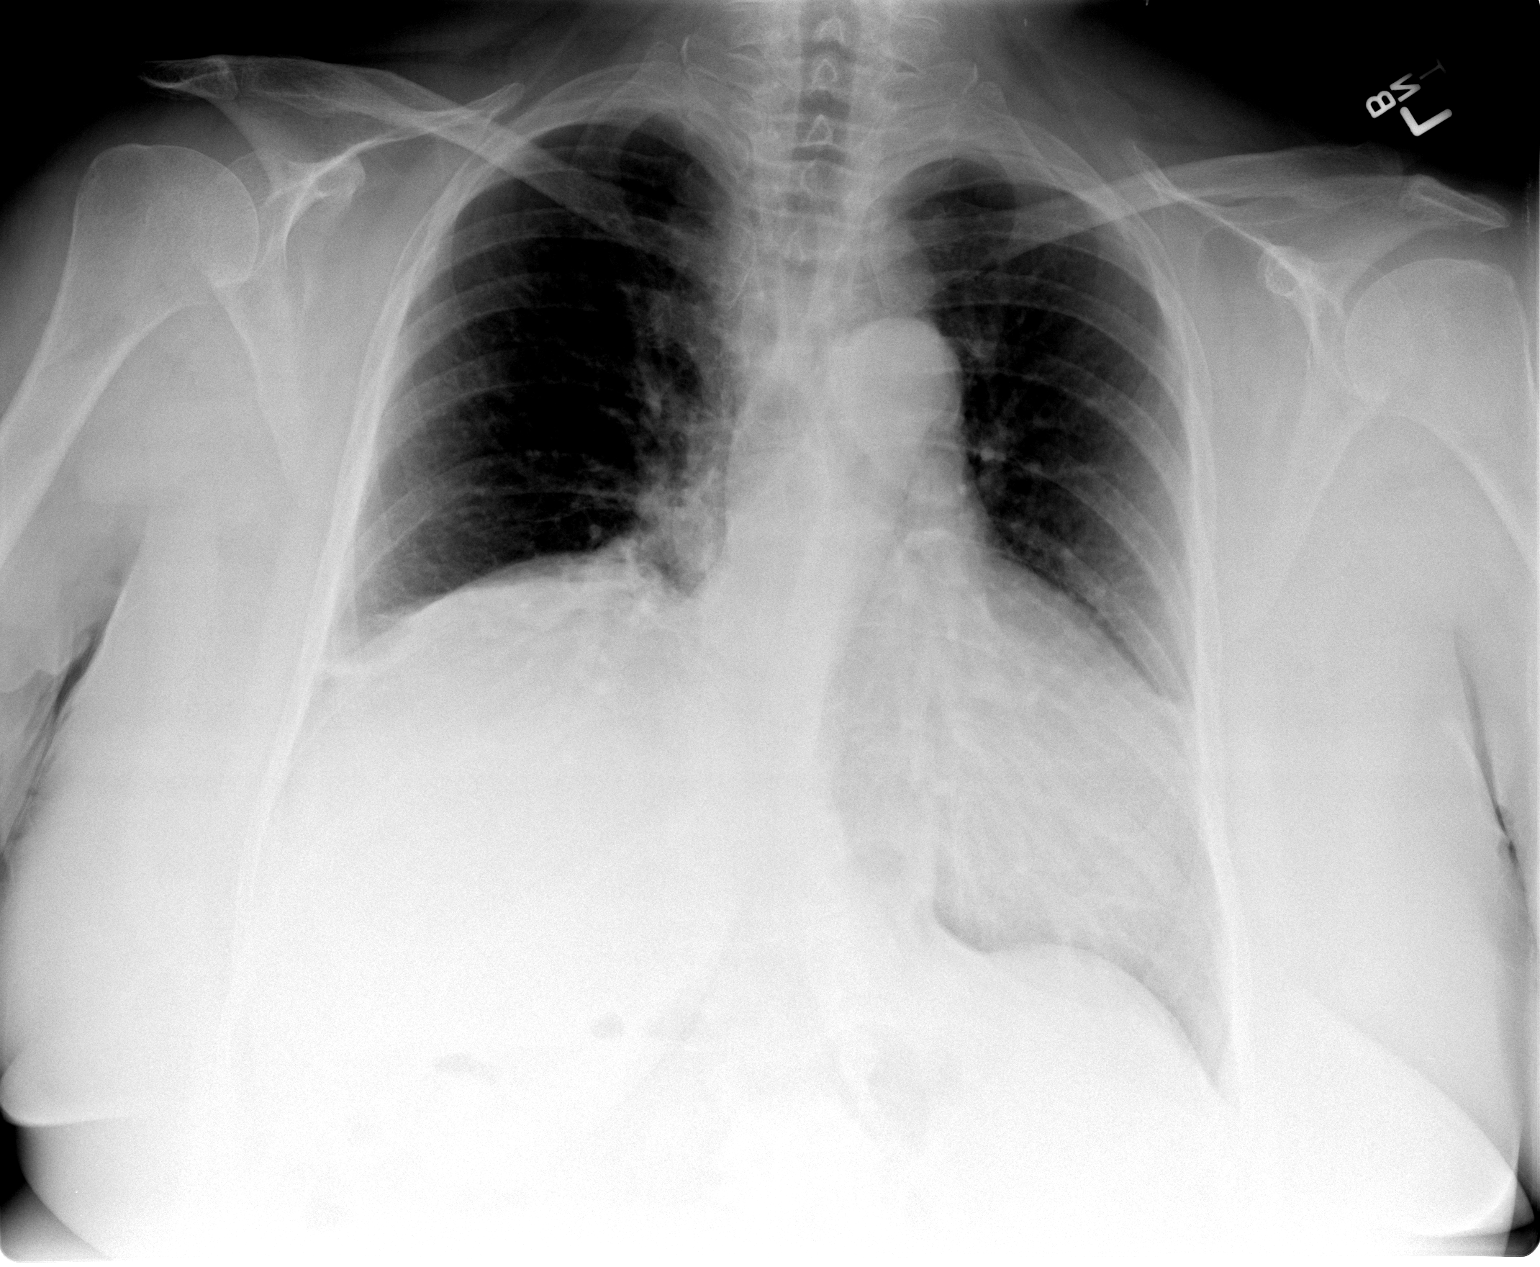

[view not recorded (2 of 2)]
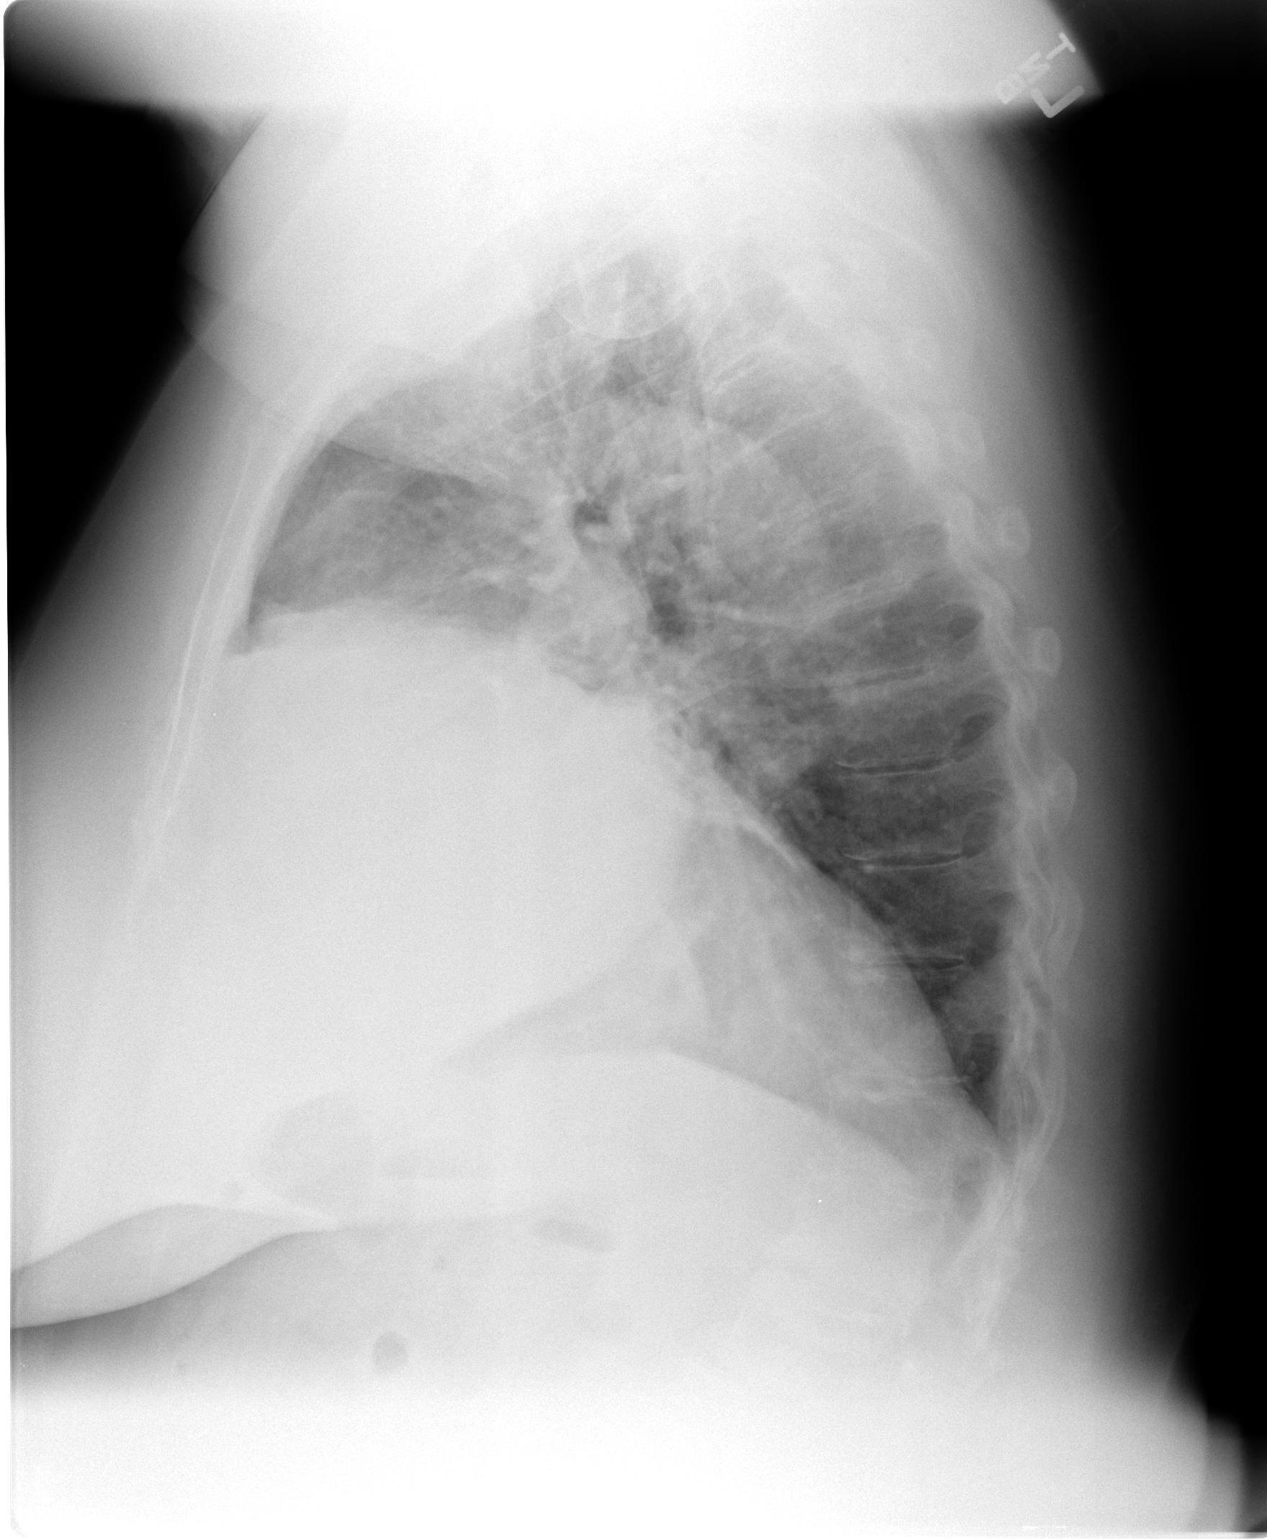

[2 of 2 positions shown; findings below may reference images not displayed]

FINDINGS: Right hemidiaphragm remains chronically elevated. The heart is
moderately enlarged. Right basilar atelectasis is stable. Minimal
atelectasis at the left base. No pneumothorax. No pleural effusion.
IMPRESSION: Chronic elevation of the right hemidiaphragm with basilar
atelectasis.

## 2016-02-14 DIAGNOSIS — M1991 Primary osteoarthritis, unspecified site: Secondary | ICD-10-CM | POA: Diagnosis not present

## 2016-02-14 DIAGNOSIS — Z6841 Body Mass Index (BMI) 40.0 and over, adult: Secondary | ICD-10-CM | POA: Diagnosis not present

## 2016-02-14 DIAGNOSIS — Z1389 Encounter for screening for other disorder: Secondary | ICD-10-CM | POA: Diagnosis not present

## 2016-12-25 DIAGNOSIS — E782 Mixed hyperlipidemia: Secondary | ICD-10-CM | POA: Diagnosis not present

## 2016-12-25 DIAGNOSIS — Z6841 Body Mass Index (BMI) 40.0 and over, adult: Secondary | ICD-10-CM | POA: Diagnosis not present

## 2016-12-25 DIAGNOSIS — E039 Hypothyroidism, unspecified: Secondary | ICD-10-CM | POA: Diagnosis not present

## 2016-12-25 DIAGNOSIS — Z1389 Encounter for screening for other disorder: Secondary | ICD-10-CM | POA: Diagnosis not present

## 2016-12-25 DIAGNOSIS — I10 Essential (primary) hypertension: Secondary | ICD-10-CM | POA: Diagnosis not present

## 2017-06-30 DIAGNOSIS — E063 Autoimmune thyroiditis: Secondary | ICD-10-CM | POA: Diagnosis not present

## 2017-06-30 DIAGNOSIS — Z1389 Encounter for screening for other disorder: Secondary | ICD-10-CM | POA: Diagnosis not present

## 2017-06-30 DIAGNOSIS — R0789 Other chest pain: Secondary | ICD-10-CM | POA: Diagnosis not present

## 2017-06-30 DIAGNOSIS — Z6841 Body Mass Index (BMI) 40.0 and over, adult: Secondary | ICD-10-CM | POA: Diagnosis not present

## 2017-06-30 DIAGNOSIS — R6884 Jaw pain: Secondary | ICD-10-CM | POA: Diagnosis not present

## 2017-06-30 DIAGNOSIS — R252 Cramp and spasm: Secondary | ICD-10-CM | POA: Diagnosis not present

## 2017-07-17 ENCOUNTER — Encounter: Payer: Self-pay | Admitting: Cardiology

## 2017-07-30 ENCOUNTER — Encounter: Payer: Self-pay | Admitting: Cardiology

## 2017-11-19 DIAGNOSIS — E063 Autoimmune thyroiditis: Secondary | ICD-10-CM | POA: Diagnosis not present

## 2017-11-19 DIAGNOSIS — Z6841 Body Mass Index (BMI) 40.0 and over, adult: Secondary | ICD-10-CM | POA: Diagnosis not present

## 2017-11-19 DIAGNOSIS — F329 Major depressive disorder, single episode, unspecified: Secondary | ICD-10-CM | POA: Diagnosis not present

## 2017-11-19 DIAGNOSIS — K219 Gastro-esophageal reflux disease without esophagitis: Secondary | ICD-10-CM | POA: Diagnosis not present

## 2017-11-19 DIAGNOSIS — I10 Essential (primary) hypertension: Secondary | ICD-10-CM | POA: Diagnosis not present

## 2017-11-19 DIAGNOSIS — M1991 Primary osteoarthritis, unspecified site: Secondary | ICD-10-CM | POA: Diagnosis not present

## 2018-01-19 DIAGNOSIS — Z6841 Body Mass Index (BMI) 40.0 and over, adult: Secondary | ICD-10-CM | POA: Diagnosis not present

## 2018-01-19 DIAGNOSIS — E782 Mixed hyperlipidemia: Secondary | ICD-10-CM | POA: Diagnosis not present

## 2018-01-19 DIAGNOSIS — I10 Essential (primary) hypertension: Secondary | ICD-10-CM | POA: Diagnosis not present

## 2018-05-15 ENCOUNTER — Observation Stay (HOSPITAL_COMMUNITY)
Admission: EM | Admit: 2018-05-15 | Discharge: 2018-05-16 | Disposition: A | Discharge status: Home / Self Care | Payer: Medicare Other | Attending: Internal Medicine | Admitting: Internal Medicine

## 2018-05-15 ENCOUNTER — Emergency Department (HOSPITAL_COMMUNITY): Payer: Medicare Other

## 2018-05-15 ENCOUNTER — Encounter (HOSPITAL_COMMUNITY): Payer: Self-pay | Admitting: Emergency Medicine

## 2018-05-15 ENCOUNTER — Other Ambulatory Visit: Payer: Self-pay

## 2018-05-15 DIAGNOSIS — R0789 Other chest pain: Principal | ICD-10-CM

## 2018-05-15 DIAGNOSIS — Z9889 Other specified postprocedural states: Secondary | ICD-10-CM | POA: Diagnosis not present

## 2018-05-15 DIAGNOSIS — Z882 Allergy status to sulfonamides status: Secondary | ICD-10-CM | POA: Insufficient documentation

## 2018-05-15 DIAGNOSIS — J986 Disorders of diaphragm: Secondary | ICD-10-CM | POA: Insufficient documentation

## 2018-05-15 DIAGNOSIS — E669 Obesity, unspecified: Secondary | ICD-10-CM | POA: Insufficient documentation

## 2018-05-15 DIAGNOSIS — Z8249 Family history of ischemic heart disease and other diseases of the circulatory system: Secondary | ICD-10-CM | POA: Diagnosis not present

## 2018-05-15 DIAGNOSIS — I10 Essential (primary) hypertension: Secondary | ICD-10-CM | POA: Diagnosis present

## 2018-05-15 DIAGNOSIS — F329 Major depressive disorder, single episode, unspecified: Secondary | ICD-10-CM

## 2018-05-15 DIAGNOSIS — E039 Hypothyroidism, unspecified: Secondary | ICD-10-CM | POA: Diagnosis not present

## 2018-05-15 DIAGNOSIS — E079 Disorder of thyroid, unspecified: Secondary | ICD-10-CM

## 2018-05-15 DIAGNOSIS — Z6841 Body Mass Index (BMI) 40.0 and over, adult: Secondary | ICD-10-CM | POA: Diagnosis not present

## 2018-05-15 DIAGNOSIS — I209 Angina pectoris, unspecified: Secondary | ICD-10-CM | POA: Diagnosis not present

## 2018-05-15 DIAGNOSIS — Z6839 Body mass index (BMI) 39.0-39.9, adult: Secondary | ICD-10-CM | POA: Diagnosis not present

## 2018-05-15 DIAGNOSIS — Z79899 Other long term (current) drug therapy: Secondary | ICD-10-CM | POA: Insufficient documentation

## 2018-05-15 DIAGNOSIS — Z1389 Encounter for screening for other disorder: Secondary | ICD-10-CM | POA: Diagnosis not present

## 2018-05-15 DIAGNOSIS — R079 Chest pain, unspecified: Secondary | ICD-10-CM | POA: Diagnosis not present

## 2018-05-15 DIAGNOSIS — R000 Tachycardia, unspecified: Secondary | ICD-10-CM | POA: Diagnosis not present

## 2018-05-15 DIAGNOSIS — Z79890 Hormone replacement therapy (postmenopausal): Secondary | ICD-10-CM | POA: Diagnosis not present

## 2018-05-15 DIAGNOSIS — R0602 Shortness of breath: Secondary | ICD-10-CM | POA: Diagnosis not present

## 2018-05-15 DIAGNOSIS — J9811 Atelectasis: Secondary | ICD-10-CM | POA: Diagnosis not present

## 2018-05-15 DIAGNOSIS — F32A Depression, unspecified: Secondary | ICD-10-CM

## 2018-05-15 DIAGNOSIS — M1990 Unspecified osteoarthritis, unspecified site: Secondary | ICD-10-CM | POA: Diagnosis not present

## 2018-05-15 DIAGNOSIS — R0689 Other abnormalities of breathing: Secondary | ICD-10-CM | POA: Diagnosis not present

## 2018-05-15 HISTORY — DX: Unspecified osteoarthritis, unspecified site: M19.90

## 2018-05-15 HISTORY — DX: Depression, unspecified: F32.A

## 2018-05-15 HISTORY — DX: Disorder of thyroid, unspecified: E07.9

## 2018-05-15 HISTORY — DX: Essential (primary) hypertension: I10

## 2018-05-15 HISTORY — DX: Major depressive disorder, single episode, unspecified: F32.9

## 2018-05-15 LAB — CBC
HEMATOCRIT: 38.5 % (ref 36.0–46.0)
Hemoglobin: 12.7 g/dL (ref 12.0–15.0)
MCH: 29.5 pg (ref 26.0–34.0)
MCHC: 33 g/dL (ref 30.0–36.0)
MCV: 89.3 fL (ref 78.0–100.0)
Platelets: 244 10*3/uL (ref 150–400)
RBC: 4.31 MIL/uL (ref 3.87–5.11)
RDW: 13.3 % (ref 11.5–15.5)
WBC: 7.8 10*3/uL (ref 4.0–10.5)

## 2018-05-15 LAB — BASIC METABOLIC PANEL
Anion gap: 10 (ref 5–15)
BUN: 31 mg/dL — ABNORMAL HIGH (ref 6–20)
CHLORIDE: 101 mmol/L (ref 101–111)
CO2: 26 mmol/L (ref 22–32)
Calcium: 9.2 mg/dL (ref 8.9–10.3)
Creatinine, Ser: 0.9 mg/dL (ref 0.44–1.00)
GFR calc non Af Amer: >60 mL/min (ref >60)
Glucose, Bld: 115 mg/dL — ABNORMAL HIGH (ref 65–99)
POTASSIUM: 4 mmol/L (ref 3.5–5.1)
SODIUM: 137 mmol/L (ref 135–145)

## 2018-05-15 LAB — TROPONIN I
Troponin I: <0.03 ng/mL (ref <0.03)
Troponin I: <0.03 ng/mL (ref <0.03)

## 2018-05-15 MED ORDER — ASPIRIN EC 325 MG PO TBEC
325.0000 mg | DELAYED_RELEASE_TABLET | Freq: Every day | ORAL | Status: DC
  Administered 2018-05-16: 325 mg via ORAL
  Filled 2018-05-15: qty 1

## 2018-05-15 MED ORDER — ZOLPIDEM TARTRATE 5 MG PO TABS: 5.0000 mg | ORAL_TABLET | Freq: Every evening | ORAL | Status: DC | PRN

## 2018-05-15 MED ORDER — LEVOTHYROXINE SODIUM 75 MCG PO TABS
150.0000 ug | ORAL_TABLET | Freq: Every day | ORAL | Status: DC
  Administered 2018-05-16: 150 ug via ORAL
  Filled 2018-05-15: qty 2

## 2018-05-15 MED ORDER — ACETAMINOPHEN 325 MG PO TABS: 650.0000 mg | ORAL_TABLET | ORAL | Status: DC | PRN

## 2018-05-15 MED ORDER — ATENOLOL 100 MG PO TABS
100.0000 mg | ORAL_TABLET | Freq: Every day | ORAL | Status: DC
  Filled 2018-05-15: qty 1

## 2018-05-15 MED ORDER — DULOXETINE HCL 60 MG PO CPEP
60.0000 mg | ORAL_CAPSULE | Freq: Every day | ORAL | Status: DC
  Filled 2018-05-15: qty 1

## 2018-05-15 MED ORDER — GI COCKTAIL ~~LOC~~: 30.0000 mL | Freq: Four times a day (QID) | ORAL | Status: DC | PRN

## 2018-05-15 MED ORDER — ENOXAPARIN SODIUM 40 MG/0.4ML ~~LOC~~ SOLN
40.0000 mg | Freq: Every day | SUBCUTANEOUS | Status: DC
  Filled 2018-05-15 (×2): qty 0.4

## 2018-05-15 MED ORDER — ONDANSETRON HCL 4 MG/2ML IJ SOLN
4.0000 mg | Freq: Four times a day (QID) | INTRAMUSCULAR | Status: DC | PRN
Start: 2018-05-15 — End: 2018-05-16

## 2018-05-15 MED ORDER — PANTOPRAZOLE SODIUM 40 MG PO TBEC
80.0000 mg | DELAYED_RELEASE_TABLET | Freq: Every day | ORAL | Status: DC
  Administered 2018-05-16: 80 mg via ORAL
  Filled 2018-05-15: qty 2

## 2018-05-15 MED ORDER — HYDROCHLOROTHIAZIDE 25 MG PO TABS
25.0000 mg | ORAL_TABLET | Freq: Every day | ORAL | Status: DC
  Filled 2018-05-15: qty 1

## 2018-05-15 MED ORDER — NABUMETONE 500 MG PO TABS
1000.0000 mg | ORAL_TABLET | Freq: Two times a day (BID) | ORAL | Status: DC
  Administered 2018-05-16: 1000 mg via ORAL
  Filled 2018-05-15 (×2): qty 2

## 2018-05-15 MED ORDER — LISINOPRIL 20 MG PO TABS
20.0000 mg | ORAL_TABLET | Freq: Every day | ORAL | Status: DC
  Filled 2018-05-15: qty 1

## 2018-05-15 NOTE — H&P (Signed)
History and Physical  Latoya BannerDonna R Crissman ZOX:[id number] DOB: 12-May-1951 DOA: 05/15/2018  Referring physician: Dr Clayborne DanaMesner, ED physician PCP: Shawnie DapperMann, Benjamin L, PA-C  Outpatient Specialists:  Patient Coming From: home  Chief Complaint: intermittent chest pain  HPI: Latoya [name] is a 67 y.o. female with a history of hypothyroidism, CHTN, arthritis, depression.  Patient reports 2 months of intermittent chest pain that starts in the left substernal area and radiates into the jaw and down her arms bilaterally.  Her pains are becoming more frequent and happened 3 times this week.  The chest pain is varying in intensity chest pain occurs when she is active and when she is bending over.  She went to her PCPs office today for evaluation and was promptly sent here.  Pain is self-limiting and last a few minutes.  No relieving or provoking factors.  Cardiac risk factors: Obesity, CHTN, History of coronary artery disease  Emergency Department Course: First troponin negative.  Review of Systems:   Pt denies any fevers, chills, nausea, vomiting, diarrhea, constipation, abdominal pain, shortness of breath, dyspnea on exertion, orthopnea, cough, wheezing, palpitations, headache, vision changes, lightheadedness, dizziness, melena, rectal bleeding.  Review of systems are otherwise negative  Past Medical History:  Diagnosis Date  . Arthritis   . Depression   . Hypertension   . Thyroid disease    Past Surgical History:  Procedure Laterality Date  . cyst removed from neck    . KNEE SURGERY Bilateral    Social History:  reports that she has never smoked. She has never used smokeless tobacco. She reports that she does not drink alcohol or use drugs. Patient lives at home  Allergies  Allergen Reactions  . Sulfa Antibiotics     No family history on file.  Family history of coronary artery disease: Her brother had a fairly significant MI in his early 6060s.  Her dad had multiple stenting and CABG in his 3180s.   Cardiac risk factors: Hypertension,  Prior to Admission medications   Medication Sig Start Date End Date Taking? Authorizing Provider  atenolol (TENORMIN) 100 MG tablet Take 1 tablet by mouth daily.    Yes [provider]  DULoxetine (CYMBALTA) 60 MG capsule Take 60 mg by mouth daily.   Yes [provider]  hydrochlorothiazide (HYDRODIURIL) 25 MG tablet Take 1 tablet by mouth daily.    Yes [provider]  levothyroxine (SYNTHROID, LEVOTHROID) 150 MCG tablet Take 150 mcg by mouth daily before breakfast.   Yes [provider]  lisinopril (PRINIVIL,ZESTRIL) 20 MG tablet Take 20 mg by mouth daily.    Yes [provider]  nabumetone (RELAFEN) 500 MG tablet Take 1,000 mg by mouth 2 (two) times daily.   Yes [provider]  omeprazole (PRILOSEC) 40 MG capsule Take 40 mg by mouth daily.   Yes [provider]    Physical Exam: BP 119/77 (BP Location: Left Arm)   Pulse 72   Temp 98.9 F (37.2 C) (Oral)   Resp 19   Ht 5\' 5"  (1.651 m)   Wt 108 kg (238 lb)   SpO2 99%   BMI 39.61 kg/m   . General: Elderly Caucasian female. Awake and alert and oriented x3. No acute cardiopulmonary distress.  Marland Kitchen. HEENT: Normocephalic atraumatic.  Right and left ears normal in appearance.  Pupils equal, round, reactive to light. Extraocular muscles are intact. Sclerae anicteric and noninjected.  Moist mucosal membranes. No mucosal lesions.  . Neck: Neck supple without lymphadenopathy. No carotid  bruits. No masses palpated.  . Cardiovascular: Regular rate with normal S1-S2 sounds. No murmurs, rubs, gallops auscultated. No JVD.  Marland Kitchen Respiratory: Good respiratory effort with no wheezes, rales, rhonchi. Lungs clear to auscultation bilaterally.  No accessory muscle use. . Abdomen: Soft, nontender, nondistended. Active bowel sounds. No masses or hepatosplenomegaly  . Skin: No rashes, lesions, or ulcerations.  Dry, warm to touch. 2+ dorsalis pedis and radial  pulses. . Musculoskeletal: No calf or leg pain. All major joints not erythematous nontender.  No upper or lower joint deformation.  Good ROM.  No contractures  . Psychiatric: Intact judgment and insight. Pleasant and cooperative. . Neurologic: No focal neurological deficits. Strength is 5/5 and symmetric in upper and lower extremities.  Cranial nerves II through XII are grossly intact.           Labs on Admission: I have personally reviewed following labs and imaging studies  CBC: Recent Labs  Lab 05/15/18 1607  WBC 7.8  HGB 12.7  HCT 38.5  MCV 89.3  PLT 244   Basic Metabolic Panel: Recent Labs  Lab 05/15/18 1607  NA 137  K 4.0  CL 101  CO2 26  GLUCOSE 115*  BUN 31*  CREATININE 0.90  CALCIUM 9.2   GFR: Estimated Creatinine Clearance: 76.1 mL/min (by C-G formula based on SCr of 0.9 mg/dL). Liver Function Tests: No results for input(s): AST, ALT, ALKPHOS, BILITOT, PROT, ALBUMIN in the last 168 hours. No results for input(s): LIPASE, AMYLASE in the last 168 hours. No results for input(s): AMMONIA in the last 168 hours. Coagulation Profile: No results for input(s): INR, PROTIME in the last 168 hours. Cardiac Enzymes: Recent Labs  Lab 05/15/18 1607  TROPONINI <0.03   BNP (last 3 results) No results for input(s): PROBNP in the last 8760 hours. HbA1C: No results for input(s): HGBA1C in the last 72 hours. CBG: No results for input(s): GLUCAP in the last 168 hours. Lipid Profile: No results for input(s): CHOL, HDL, LDLCALC, TRIG, CHOLHDL, LDLDIRECT in the last 72 hours. Thyroid Function Tests: No results for input(s): TSH, T4TOTAL, FREET4, T3FREE, THYROIDAB in the last 72 hours. Anemia Panel: No results for input(s): VITAMINB12, FOLATE, FERRITIN, TIBC, IRON, RETICCTPCT in the last 72 hours. Urine analysis: No results found for: COLORURINE, APPEARANCEUR, LABSPEC, PHURINE, GLUCOSEU, HGBUR, BILIRUBINUR, KETONESUR, PROTEINUR, UROBILINOGEN, NITRITE, LEUKOCYTESUR Sepsis  Labs: @LABRCNTIP (procalcitonin:4,lacticidven:4) )No results found for this or any previous visit (from the past 240 hour(s)).   Radiological Exams on Admission: Dg Chest 2 View  Result Date: 05/15/2018 CLINICAL DATA:  Chest pain. EXAM: CHEST - 2 VIEW COMPARISON:  10/25/2014 FINDINGS: The cardiomediastinal silhouette is unchanged with persistent obscuration of the right heart border. Aortic atherosclerosis is noted. There is persistent elevation/anterior eventration of the right hemidiaphragm. Associated right basilar atelectasis is less than on the prior study. The left lung remains clear. No pleural effusion or pneumothorax is identified. No acute osseous abnormality is seen. IMPRESSION: Chronic elevation of the right hemidiaphragm with right basilar atelectasis. Electronically Signed   By: Sebastian Ache M.D.   On: 05/15/2018 17:37    EKG: Independently reviewed.  Sinus rhythm.  Nonspecific ST changes in lateral leads.  Assessment/Plan: Active Problems:   Hypertension   Depression   Hypothyroid   Chest pain    This patient was discussed with the ED physician, including pertinent vitals, physical exam findings, labs, and imaging.  We also discussed care given by the ED provider.  1. Chest pain a. Observation on telemetry b. Serial  troponins c. Cardiology  to see in AM d. we will keep n.p.o.Tomorrow morning as I believe she needs a chemical stress test 2. Hypertension a. Continue antihypertensive 3. Depression a. Continue antidepressants 4. Hypothyroidism a. Continue Synthroid   DVT prophylaxis: Lovenox Consultants: Cardiology Code Status: Full code Family Communication: None Disposition Plan: Home following rule out   Levie Heritage, DO [facility] Pager [phone]  If 7PM-7AM, please contact night-coverage [email or web address] Password [email or web address]

## 2018-05-15 NOTE — ED Triage Notes (Signed)
Pt states that she has been having spells of pains in her chest down her arms and jaw for the past 2 months

## 2018-05-16 ENCOUNTER — Encounter (HOSPITAL_COMMUNITY): Payer: Self-pay | Admitting: *Deleted

## 2018-05-16 ENCOUNTER — Other Ambulatory Visit: Payer: Self-pay

## 2018-05-16 ENCOUNTER — Other Ambulatory Visit: Payer: Self-pay | Admitting: Cardiology

## 2018-05-16 DIAGNOSIS — R0789 Other chest pain: Principal | ICD-10-CM

## 2018-05-16 DIAGNOSIS — E039 Hypothyroidism, unspecified: Secondary | ICD-10-CM

## 2018-05-16 DIAGNOSIS — I10 Essential (primary) hypertension: Secondary | ICD-10-CM

## 2018-05-16 DIAGNOSIS — R079 Chest pain, unspecified: Secondary | ICD-10-CM

## 2018-05-16 LAB — HIV ANTIBODY (ROUTINE TESTING W REFLEX): HIV Screen 4th Generation wRfx: NONREACTIVE

## 2018-05-16 MED ORDER — HYDROCHLOROTHIAZIDE 25 MG PO TABS: 25.0000 mg | ORAL_TABLET | Freq: Every day | ORAL | Status: DC

## 2018-05-16 MED ORDER — NITROGLYCERIN 0.4 MG SL SUBL: 0.4000 mg | SUBLINGUAL_TABLET | SUBLINGUAL | 0 refills | Status: DC | PRN

## 2018-05-16 MED ORDER — NABUMETONE 500 MG PO TABS
1000.0000 mg | ORAL_TABLET | Freq: Once | ORAL | Status: AC
  Administered 2018-05-16: 1000 mg via ORAL
  Filled 2018-05-16: qty 2

## 2018-05-16 MED ORDER — LISINOPRIL 20 MG PO TABS: 20.0000 mg | ORAL_TABLET | Freq: Every day | ORAL | Status: DC

## 2018-05-16 MED ORDER — ATENOLOL 100 MG PO TABS: 100.0000 mg | ORAL_TABLET | Freq: Every day | ORAL | Status: DC

## 2018-05-16 NOTE — Consult Note (Signed)
Cardiology Consultation:   Patient ID: Latoya [name]; [id number]; 1951/07/04   Admit date: 05/15/2018 Date of Consult: 05/16/2018  Primary Care Provider: Shawnie Dapper, PA-C Primary Cardiologist: No primary care provider on file. [place] Stevick Primary Electrophysiologist:  none   Patient Profile:   Latoya [name] is a 67 y.o. female with a hx of hypertension, depression who is being seen today for the evaluation of chest pain at the request of Dr Hanley Ben.  History of Present Illness:   Latoya [name] is a 67 year old female with hypertension, depression, hypothyroidism who presented with intermittent chest discomfort over the past several days/2 months.  Occurring in the substernal region, left-sided sometimes radiates to Latoya jaw and down both arms she states.  They seem to be happening more and more frequently, perhaps 2-3 times in the past week.  No association with food.  Sometimes pain can be quite severe she states.  Can occur when physically active mainly when bending over.  She explained a recent experience when bending over in Latoya front yard to pick up glass-like material which she collects and after getting back up having this sensation occur.  Yesterday, she went to Latoya primary care physician for evaluation and she was sent to the emergency room.  Usually the pain lasts a couple minutes in duration.  Latoya [name] had bypass surgery later in his life in his 39s.  Latoya [name] in his early 75s died of a heart attack.  Here in the hospital, Latoya troponins have been normal.  ECG sinus rhythm with subtle T wave inversion, ST scooping in 1 and aVL, lateral leads personally viewed.  Prior echocardiogram in 2016 showed normal EF with trivial pericardial effusion  Past Medical History:  Diagnosis Date  . Arthritis   . Depression   . Hypertension   . Thyroid disease     Past Surgical History:  Procedure Laterality Date  . cyst removed from neck    . KNEE SURGERY Bilateral      Home  Medications:  Prior to Admission medications   Medication Sig Start Date End Date Taking? Authorizing Provider  atenolol (TENORMIN) 100 MG tablet Take 1 tablet by mouth daily.    Yes [provider]  DULoxetine (CYMBALTA) 60 MG capsule Take 60 mg by mouth daily.   Yes [provider]  hydrochlorothiazide (HYDRODIURIL) 25 MG tablet Take 1 tablet by mouth daily.    Yes [provider]  levothyroxine (SYNTHROID, LEVOTHROID) 150 MCG tablet Take 150 mcg by mouth daily before breakfast.   Yes [provider]  lisinopril (PRINIVIL,ZESTRIL) 20 MG tablet Take 20 mg by mouth daily.    Yes [provider]  nabumetone (RELAFEN) 500 MG tablet Take 1,000 mg by mouth 2 (two) times daily.   Yes [provider]  omeprazole (PRILOSEC) 40 MG capsule Take 40 mg by mouth daily.   Yes [provider]    Inpatient Medications: Scheduled Meds: . aspirin EC  325 mg Oral Daily  . atenolol  100 mg Oral QHS  . DULoxetine  60 mg Oral Daily  . enoxaparin (LOVENOX) injection  40 mg Subcutaneous Daily  . hydrochlorothiazide  25 mg Oral QHS  . levothyroxine  150 mcg Oral QAC breakfast  . lisinopril  20 mg Oral QHS  . nabumetone  1,000 mg Oral BID WC  . pantoprazole  80 mg Oral Daily   Continuous Infusions:  PRN Meds: acetaminophen, gi cocktail, ondansetron (ZOFRAN) IV, zolpidem  Allergies:  Allergies  Allergen Reactions  . Sulfa Antibiotics     Social History:   Social History   Socioeconomic History  . Marital status: Divorced    Spouse name: Not on file  . Number of children: Not on file  . Years of education: Not on file  . Highest education level: Not on file  Occupational History  . Not on file  Social Needs  . Financial resource strain: Not on file  . Food insecurity:    Worry: Not on file    Inability: Not on file  . Transportation needs:    Medical: Not on file    Non-medical: Not on file  Tobacco Use  . Smoking status:  Never Smoker  . Smokeless tobacco: Never Used  Substance and Sexual Activity  . Alcohol use: Never    Frequency: Never  . Drug use: Never  . Sexual activity: Never  Lifestyle  . Physical activity:    Days per week: Not on file    Minutes per session: Not on file  . Stress: Not on file  Relationships  . Social connections:    Talks on phone: Not on file    Gets together: Not on file    Attends religious service: Not on file    Active member of club or organization: Not on file    Attends meetings of clubs or organizations: Not on file    Relationship status: Not on file  . Intimate partner violence:    Fear of current or ex partner: Not on file    Emotionally abused: Not on file    Physically abused: Not on file    Forced sexual activity: Not on file  Other Topics Concern  . Not on file  Social History Narrative  . Not on file    Family History:    Latoya [name] had bypass surgery later in his life in his 5380s.  Latoya [name] in his early 2960s died of a heart attack.   ROS:  Please see the history of present illness.  Denies any fevers chills nausea vomiting syncope bleeding All other ROS reviewed and negative.     Physical Exam/Data:   Vitals:   05/15/18 2050 05/15/18 2141 05/16/18 0032 05/16/18 0430  BP: 134/84 (!) 151/77 106/67 136/81  Pulse: 74 70 70 75  Resp: 19 18  20   Temp:  98.5 F (36.9 C) 98.8 F (37.1 C) 98 F (36.7 C)  TempSrc:  Oral Oral Oral  SpO2: 100% 100% 99% 100%  Weight:  236 lb (107 kg)  230 lb 8 oz (104.6 kg)  Height:        Intake/Output Summary (Last 24 hours) at 05/16/2018 0913 Last data filed at 05/16/2018 0735 Gross per 24 hour  Intake 462 ml  Output 200 ml  Net 262 ml   Filed Weights   05/15/18 1548 05/15/18 2141 05/16/18 0430  Weight: 238 lb (108 kg) 236 lb (107 kg) 230 lb 8 oz (104.6 kg)   Body mass index is 38.36 kg/m.  General:  Well nourished, well developed, in no acute distress, obese HEENT: normal Lymph: no  adenopathy Neck: no JVD Endocrine:  No thryomegaly Vascular: No carotid bruits  Cardiac:  normal S1, S2; RRR; no murmur  Lungs:  clear to auscultation bilaterally, no wheezing, rhonchi or rales  Abd: soft, nontender, no hepatomegaly  Ext: no edema Musculoskeletal:  No deformities, BUE and BLE strength normal and equal Skin: warm and dry  Neuro:  CNs  2-12 intact, no focal abnormalities noted Psych:  Normal affect   EKG:  The EKG was personally reviewed and demonstrates: Sinus rhythm, subtle ST segment scooping in 1 and aVL.  Telemetry:  Telemetry was personally reviewed and demonstrates: No adverse arrhythmias.  Relevant CV Studies:  Echocardiogram in 2016 showed normal EF with trivial pericardial effusion  Laboratory Data:  Chemistry Recent Labs  Lab 05/15/18 1607  NA 137  K 4.0  CL 101  CO2 26  GLUCOSE 115*  BUN 31*  CREATININE 0.90  CALCIUM 9.2  GFRNONAA >60  GFRAA >60  ANIONGAP 10    No results for input(s): PROT, ALBUMIN, AST, ALT, ALKPHOS, BILITOT in the last 168 hours. Hematology Recent Labs  Lab 05/15/18 1607  WBC 7.8  RBC 4.31  HGB 12.7  HCT 38.5  MCV 89.3  MCH 29.5  MCHC 33.0  RDW 13.3  PLT 244   Cardiac Enzymes Recent Labs  Lab 05/15/18 1607 05/15/18 1923  TROPONINI <0.03 <0.03   No results for input(s): TROPIPOC in the last 168 hours.  BNPNo results for input(s): BNP, PROBNP in the last 168 hours.  DDimer No results for input(s): DDIMER in the last 168 hours.  Radiology/Studies:  Dg Chest 2 View  Result Date: 05/15/2018 CLINICAL DATA:  Chest pain. EXAM: CHEST - 2 VIEW COMPARISON:  10/25/2014 FINDINGS: The cardiomediastinal silhouette is unchanged with persistent obscuration of the right heart border. Aortic atherosclerosis is noted. There is persistent elevation/anterior eventration of the right hemidiaphragm. Associated right basilar atelectasis is less than on the prior study. The left lung remains clear. No pleural effusion or  pneumothorax is identified. No acute osseous abnormality is seen. IMPRESSION: Chronic elevation of the right hemidiaphragm with right basilar atelectasis. Electronically Signed   By: Sebastian Ache M.D.   On: 05/15/2018 17:37    Assessment and Plan:   Atypical intermittent chest discomfort -Troponins have been reassuring, no signs of myocardial injury.  ECG shows subtle T wave changes in 1 and aVL, lateral leads.  Chest pain can occur after bending over for an extended period of time.  She did not state that she had chest discomfort when walking, although she does have knee pain which limits Latoya activity. -I think a coronary CT scan with calcium score would be helpful for Latoya and I would like to set this up for Latoya as outpatient.  She is amenable to this plan.  We described alternative diagnostic strategies including cardiac catheterization or nuclear stress test.  If CT scan is abnormal, proceed with cardiac cath. -Differential does include coronary artery disease as well as GI, musculoskeletal.  Persistent right hemidiaphragm elevation -Chronic.  May be contributing to Latoya sensation of shortness of breath previously.  Obesity - Continue to encourage weight loss.  She states that she does not drink, does not smoke but she does eat.  Encouraged low carbohydrate diet.  Hypertension - Continue with current medications.  Overall well controlled.  In summary, we will order a coronary CT scan with calcium score for Latoya.  We will let Latoya know the results of study.  If necessary, we will have follow-up based upon results of CT scan.  I am comfortable with Latoya discharge home.  For questions or updates, please contact [facility] Please consult [email or web address] for contact info under Cardiology/STEMI.   Signed, Donato Schultz, MD  05/16/2018 9:13 AM

## 2018-05-16 NOTE — Discharge Summary (Signed)
Physician Discharge Summary  Latoya BannerDonna R Meuser ZOX:[id number] DOB: 1950/12/23 DOA: 05/15/2018  PCP: Shawnie DapperMann, Benjamin L, PA-C  Admit date: 05/15/2018 Discharge date: 05/16/2018  Admitted From: Home Disposition: Home  Recommendations for Outpatient Follow-up:  1. Follow up with PCP in 1 week 2. Outpatient follow-up with cardiology   Home Health: No Equipment/Devices: None  Discharge Condition: Stable CODE STATUS: Full Diet recommendation: Heart Healthy   Brief/Interim Summary: 67 year old female with history of hypothyroidism, hypertension, arthritis, depression presented with intermittent chest pain for 2 months.  Cardiology has evaluated the patient and cleared the patient for discharge.  Currently patient is chest pain-free.   Discharge Diagnoses:  Active Problems:   Hypertension   Depression   Hypothyroid   Chest pain   Chest pain -Currently chest pain-free.  Troponins have been negative so far.    Cardiology recommends outpatient follow-up with a coronary CT.  Cardiology has cleared the patient for discharge.  Hypertension -Blood pressure stable.  Continue atenolol, lisinopril and hydrochlorothiazide  Hypothyroidism -Continue Synthroid     Discharge Instructions  Discharge Instructions    Ambulatory referral to Cardiology   Complete by:  As directed    Chest pain followup   Call MD for:  difficulty breathing, headache or visual disturbances   Complete by:  As directed    Call MD for:  extreme fatigue   Complete by:  As directed    Call MD for:  hives   Complete by:  As directed    Call MD for:  persistant dizziness or light-headedness   Complete by:  As directed    Call MD for:  persistant nausea and vomiting   Complete by:  As directed    Call MD for:  severe uncontrolled pain   Complete by:  As directed    Call MD for:  temperature >100.4   Complete by:  As directed    Diet - low sodium heart healthy   Complete by:  As directed    Increase activity  slowly   Complete by:  As directed      Allergies as of 05/16/2018      Reactions   Sulfa Antibiotics       Medication List    TAKE these medications   atenolol 100 MG tablet Commonly known as:  TENORMIN Take 1 tablet by mouth daily.   DULoxetine 60 MG capsule Commonly known as:  CYMBALTA Take 60 mg by mouth daily.   hydrochlorothiazide 25 MG tablet Commonly known as:  HYDRODIURIL Take 1 tablet by mouth daily.   levothyroxine 150 MCG tablet Commonly known as:  SYNTHROID, LEVOTHROID Take 150 mcg by mouth daily before breakfast.   lisinopril 20 MG tablet Commonly known as:  PRINIVIL,ZESTRIL Take 20 mg by mouth daily.   nabumetone 500 MG tablet Commonly known as:  RELAFEN Take 1,000 mg by mouth 2 (two) times daily.   nitroGLYCERIN 0.4 MG SL tablet Commonly known as:  NITROSTAT Place 1 tablet (0.4 mg total) under the tongue every 5 (five) minutes as needed for chest pain.   omeprazole 40 MG capsule Commonly known as:  PRILOSEC Take 40 mg by mouth daily.      Follow-up Information    Shawnie DapperMann, Benjamin L, PA-C. Schedule an appointment as soon as possible for a visit in 1 week(s).   Specialties:  Physician Assistant, Internal Medicine Contact information: [address] [place] [address] [phone]        Jake BatheSkains, Mark C, MD. Schedule an appointment as  soon as possible for a visit in 1 week(s).   Specialty:  Cardiology Contact information: [address] [place] [address] [phone]          Allergies  Allergen Reactions  . Sulfa Antibiotics     Consultations:  Cardiology   Procedures/Studies: Dg Chest 2 View  Result Date: 05/15/2018 CLINICAL DATA:  Chest pain. EXAM: CHEST - 2 VIEW COMPARISON:  10/25/2014 FINDINGS: The cardiomediastinal silhouette is unchanged with persistent obscuration of the right heart border. Aortic atherosclerosis is noted. There is persistent elevation/anterior eventration of the right  hemidiaphragm. Associated right basilar atelectasis is less than on the prior study. The left lung remains clear. No pleural effusion or pneumothorax is identified. No acute osseous abnormality is seen. IMPRESSION: Chronic elevation of the right hemidiaphragm with right basilar atelectasis. Electronically Signed   By: Sebastian Ache M.D.   On: 05/15/2018 17:37      Subjective: Patient seen and examined at bedside.  She denies any current chest pain.  No overnight fever or vomiting.  Discharge Exam:  Vitals:   05/15/18 2050 05/15/18 2141 05/16/18 0032 05/16/18 0430  BP: 134/84 (!) 151/77 106/67 136/81  Pulse: 74 70 70 75  Resp: 19 18  20   Temp:  98.5 F (36.9 C) 98.8 F (37.1 C) 98 F (36.7 C)  TempSrc:  Oral Oral Oral  SpO2: 100% 100% 99% 100%  Weight:  107 kg (236 lb)  104.6 kg (230 lb 8 oz)  Height:        General exam: Appears calm and comfortable  Respiratory system: Bilateral decreased breath sound at bases Cardiovascular system: S1 & S2 heard, RRR.   Gastrointestinal system: Abdomen is nondistended, soft and nontender. Normal bowel sounds heard. Extremities: No cyanosis, clubbing, edema      The results of significant diagnostics from this hospitalization (including imaging, microbiology, ancillary and laboratory) are listed below for reference.     Microbiology: No results found for this or any previous visit (from the past 240 hour(s)).   Labs: BNP (last 3 results) No results for input(s): BNP in the last 8760 hours. Basic Metabolic Panel: Recent Labs  Lab 05/15/18 1607  NA 137  K 4.0  CL 101  CO2 26  GLUCOSE 115*  BUN 31*  CREATININE 0.90  CALCIUM 9.2   Liver Function Tests: No results for input(s): AST, ALT, ALKPHOS, BILITOT, PROT, ALBUMIN in the last 168 hours. No results for input(s): LIPASE, AMYLASE in the last 168 hours. No results for input(s): AMMONIA in the last 168 hours. CBC: Recent Labs  Lab 05/15/18 1607  WBC 7.8  HGB 12.7  HCT  38.5  MCV 89.3  PLT 244   Cardiac Enzymes: Recent Labs  Lab 05/15/18 1607 05/15/18 1923  TROPONINI <0.03 <0.03   BNP: Invalid input(s): POCBNP CBG: No results for input(s): GLUCAP in the last 168 hours. D-Dimer No results for input(s): DDIMER in the last 72 hours. Hgb A1c No results for input(s): HGBA1C in the last 72 hours. Lipid Profile No results for input(s): CHOL, HDL, LDLCALC, TRIG, CHOLHDL, LDLDIRECT in the last 72 hours. Thyroid function studies No results for input(s): TSH, T4TOTAL, T3FREE, THYROIDAB in the last 72 hours.  Invalid input(s): FREET3 Anemia work up No results for input(s): VITAMINB12, FOLATE, FERRITIN, TIBC, IRON, RETICCTPCT in the last 72 hours. Urinalysis No results found for: COLORURINE, APPEARANCEUR, LABSPEC, PHURINE, GLUCOSEU, HGBUR, BILIRUBINUR, KETONESUR, PROTEINUR, UROBILINOGEN, NITRITE, LEUKOCYTESUR Sepsis Labs Invalid input(s): PROCALCITONIN,  WBC,  LACTICIDVEN  Microbiology No results found for this or any previous visit (from the past 240 hour(s)).   Time coordinating discharge: 35 minutes  SIGNED:   Glade Lloyd, MD  [facility] 05/16/2018, 11:24 AM Pager: [phone]  If 7PM-7AM, please contact night-coverage [email or web address] Password [email or web address]

## 2018-05-16 NOTE — Progress Notes (Signed)
Order for coronary CT placed

## 2018-05-16 NOTE — Plan of Care (Signed)
  Problem: Activity: Goal: Ability to tolerate increased activity will improve Outcome: Completed/Met   Problem: Cardiac: Goal: Ability to achieve and maintain adequate cardiovascular perfusion will improve Outcome: Completed/Met   Problem: Health Behavior/Discharge Planning: Goal: Ability to safely manage health-related needs after discharge will improve Outcome: Completed/Met   Problem: Education: Goal: Knowledge of General Education information will improve Outcome: Completed/Met   Problem: Clinical Measurements: Goal: Ability to maintain clinical measurements within normal limits will improve Outcome: Completed/Met   Problem: Activity: Goal: Risk for activity intolerance will decrease Outcome: Completed/Met   Problem: Nutrition: Goal: Adequate nutrition will be maintained Outcome: Completed/Met   Problem: Coping: Goal: Level of anxiety will decrease Outcome: Completed/Met   Problem: Elimination: Goal: Will not experience complications related to bowel motility Outcome: Completed/Met   Problem: Pain Managment: Goal: General experience of comfort will improve Outcome: Completed/Met   Problem: Safety: Goal: Ability to remain free from injury will improve Outcome: Completed/Met   Problem: Skin Integrity: Goal: Risk for impaired skin integrity will decrease Outcome: Completed/Met

## 2018-05-16 NOTE — Plan of Care (Signed)
  Problem: Education: Goal: Understanding of cardiac disease, CV risk reduction, and recovery process will improve Outcome: Completed/Met   Problem: Activity: Goal: Ability to tolerate increased activity will improve Outcome: Completed/Met   Problem: Cardiac: Goal: Ability to achieve and maintain adequate cardiovascular perfusion will improve Outcome: Completed/Met   Problem: Health Behavior/Discharge Planning: Goal: Ability to safely manage health-related needs after discharge will improve Outcome: Completed/Met   Problem: Education: Goal: Knowledge of General Education information will improve Outcome: Completed/Met   Problem: Clinical Measurements: Goal: Ability to maintain clinical measurements within normal limits will improve Outcome: Completed/Met Goal: Cardiovascular complication will be avoided Outcome: Completed/Met   Problem: Activity: Goal: Risk for activity intolerance will decrease Outcome: Completed/Met   Problem: Nutrition: Goal: Adequate nutrition will be maintained Outcome: Completed/Met   Problem: Coping: Goal: Level of anxiety will decrease Outcome: Completed/Met   Problem: Elimination: Goal: Will not experience complications related to bowel motility Outcome: Completed/Met   Problem: Pain Managment: Goal: General experience of comfort will improve Outcome: Completed/Met   Problem: Safety: Goal: Ability to remain free from injury will improve Outcome: Completed/Met   Problem: Skin Integrity: Goal: Risk for impaired skin integrity will decrease Outcome: Completed/Met

## 2018-05-16 NOTE — Progress Notes (Signed)
Pt IV discontinued by NT and telemetry removed. Pt has all belongings. Discharge education went over at bedside. Pt wants to wait for lunch prior to discharge. Pt called transportation, awaiting arrival

## 2018-05-16 NOTE — Progress Notes (Signed)
Patient ID: Latoya [name], female   DOB: Apr 14, 1951, 67 y.o.   MRN: [id number]  PROGRESS NOTE    Latoya [name]  [id number] DOB: Apr 14, 1951 DOA: 05/15/2018 PCP: Samuella BruinMann, Benjamin L, PA-C   Brief Narrative:  67 year old female with history of hypothyroidism, hypertension, arthritis, depression presented with intermittent chest pain for 2 months.   Assessment & Plan:   Active Problems:   Hypertension   Depression   Hypothyroid   Chest pain   Chest pain -Currently chest pain-free.  Troponins have been negative so far.  Cardiology consulted.  Continue n.p.o. for now.  Continue aspirin and beta-blocker  Hypertension -Blood pressure stable.  Continue atenolol, lisinopril and hydrochlorothiazide  Hypothyroidism -Continue Synthroid   DVT prophylaxis: Lovenox Code Status: Full Family Communication: None at bedside Disposition Plan: Home once cleared by cardiology  Consultants: Cardiology  Procedures: None  Antimicrobials: None   Subjective: Patient seen and examined at bedside.  She denies any current chest pain.  No overnight fever or vomiting.  Objective: Vitals:   05/15/18 2050 05/15/18 2141 05/16/18 0032 05/16/18 0430  BP: 134/84 (!) 151/77 106/67 136/81  Pulse: 74 70 70 75  Resp: 19 18  20   Temp:  98.5 F (36.9 C) 98.8 F (37.1 C) 98 F (36.7 C)  TempSrc:  Oral Oral Oral  SpO2: 100% 100% 99% 100%  Weight:  107 kg (236 lb)  104.6 kg (230 lb 8 oz)  Height:        Intake/Output Summary (Last 24 hours) at 05/16/2018 0848 Last data filed at 05/16/2018 0735 Gross per 24 hour  Intake 462 ml  Output 200 ml  Net 262 ml   Filed Weights   05/15/18 1548 05/15/18 2141 05/16/18 0430  Weight: 108 kg (238 lb) 107 kg (236 lb) 104.6 kg (230 lb 8 oz)    Examination:  General exam: Appears calm and comfortable  Respiratory system: Bilateral decreased breath sound at bases Cardiovascular system: S1 & S2 heard, RRR.   Gastrointestinal system: Abdomen is  nondistended, soft and nontender. Normal bowel sounds heard. Extremities: No cyanosis, clubbing, edema    Data Reviewed: I have personally reviewed following labs and imaging studies  CBC: Recent Labs  Lab 05/15/18 1607  WBC 7.8  HGB 12.7  HCT 38.5  MCV 89.3  PLT 244   Basic Metabolic Panel: Recent Labs  Lab 05/15/18 1607  NA 137  K 4.0  CL 101  CO2 26  GLUCOSE 115*  BUN 31*  CREATININE 0.90  CALCIUM 9.2   GFR: Estimated Creatinine Clearance: 74.8 mL/min (by C-G formula based on SCr of 0.9 mg/dL). Liver Function Tests: No results for input(s): AST, ALT, ALKPHOS, BILITOT, PROT, ALBUMIN in the last 168 hours. No results for input(s): LIPASE, AMYLASE in the last 168 hours. No results for input(s): AMMONIA in the last 168 hours. Coagulation Profile: No results for input(s): INR, PROTIME in the last 168 hours. Cardiac Enzymes: Recent Labs  Lab 05/15/18 1607 05/15/18 1923  TROPONINI <0.03 <0.03   BNP (last 3 results) No results for input(s): PROBNP in the last 8760 hours. HbA1C: No results for input(s): HGBA1C in the last 72 hours. CBG: No results for input(s): GLUCAP in the last 168 hours. Lipid Profile: No results for input(s): CHOL, HDL, LDLCALC, TRIG, CHOLHDL, LDLDIRECT in the last 72 hours. Thyroid Function Tests: No results for input(s): TSH, T4TOTAL, FREET4, T3FREE, THYROIDAB in the last 72 hours. Anemia Panel: No results for input(s): VITAMINB12, FOLATE, FERRITIN, TIBC, IRON, RETICCTPCT in  the last 72 hours. Sepsis Labs: No results for input(s): PROCALCITON, LATICACIDVEN in the last 168 hours.  No results found for this or any previous visit (from the past 240 hour(s)).       Radiology Studies: Dg Chest 2 View  Result Date: 05/15/2018 CLINICAL DATA:  Chest pain. EXAM: CHEST - 2 VIEW COMPARISON:  10/25/2014 FINDINGS: The cardiomediastinal silhouette is unchanged with persistent obscuration of the right heart border. Aortic atherosclerosis is noted.  There is persistent elevation/anterior eventration of the right hemidiaphragm. Associated right basilar atelectasis is less than on the prior study. The left lung remains clear. No pleural effusion or pneumothorax is identified. No acute osseous abnormality is seen. IMPRESSION: Chronic elevation of the right hemidiaphragm with right basilar atelectasis. Electronically Signed   By: Sebastian Ache M.D.   On: 05/15/2018 17:37        Scheduled Meds: . aspirin EC  325 mg Oral Daily  . atenolol  100 mg Oral Daily  . DULoxetine  60 mg Oral Daily  . enoxaparin (LOVENOX) injection  40 mg Subcutaneous Daily  . hydrochlorothiazide  25 mg Oral Daily  . levothyroxine  150 mcg Oral QAC breakfast  . lisinopril  20 mg Oral Daily  . nabumetone  1,000 mg Oral BID WC  . pantoprazole  80 mg Oral Daily   Continuous Infusions:   LOS: 0 days        Glade Lloyd, MD [facility] Pager [phone]  If 7PM-7AM, please contact night-coverage [email or web address] Password [facility] 05/16/2018, 8:48 AM

## 2018-05-16 NOTE — ED Provider Notes (Signed)
[facility] 3E CHF Provider Note   CSN: [id number] Arrival date & time: 05/15/18  1541     History   Chief Complaint Chief Complaint  Patient presents with  . Chest Pain    HPI Latoya [name] is a 67 y.o. female.   Chest Pain   This is a recurrent problem. The current episode started more than 1 week ago. The problem occurs every several days. The problem has not changed since onset.The pain is associated with exertion. The pain is present in the substernal region. The pain is moderate. The quality of the pain is described as vice-like and pressure-like. The pain radiates to the left jaw, left neck, right neck and right jaw. Duration of episode(s) is 2 months. Associated symptoms include nausea. She has tried nothing for the symptoms. The treatment provided no relief. Risk factors include being elderly, obesity, lack of exercise and post-menopausal.    Past Medical History:  Diagnosis Date  . Arthritis   . Depression   . Hypertension   . Thyroid disease     Patient Active Problem List   Diagnosis Date Noted  . Chest pain 05/15/2018  . Hypertension   . Depression   . Arthritis   . Hypothyroid     Past Surgical History:  Procedure Laterality Date  . cyst removed from neck    . KNEE SURGERY Bilateral      OB History   None      Home Medications    Prior to Admission medications   Medication Sig Start Date End Date Taking? Authorizing Provider  atenolol (TENORMIN) 100 MG tablet Take 1 tablet by mouth daily.    Yes [provider]  DULoxetine (CYMBALTA) 60 MG capsule Take 60 mg by mouth daily.   Yes [provider]  hydrochlorothiazide (HYDRODIURIL) 25 MG tablet Take 1 tablet by mouth daily.    Yes [provider]  levothyroxine (SYNTHROID, LEVOTHROID) 150 MCG tablet Take 150 mcg by mouth daily before breakfast.   Yes [provider]  lisinopril (PRINIVIL,ZESTRIL) 20 MG tablet Take 20 mg by mouth daily.     Yes [provider]  nabumetone (RELAFEN) 500 MG tablet Take 1,000 mg by mouth 2 (two) times daily.   Yes [provider]  omeprazole (PRILOSEC) 40 MG capsule Take 40 mg by mouth daily.   Yes [provider]    Family History No family history on file.  Social History Social History   Tobacco Use  . Smoking status: Never Smoker  . Smokeless tobacco: Never Used  Substance Use Topics  . Alcohol use: Never    Frequency: Never  . Drug use: Never     Allergies   Sulfa antibiotics   Review of Systems Review of Systems  Cardiovascular: Positive for chest pain.  Gastrointestinal: Positive for nausea.  All other systems reviewed and are negative.    Physical Exam Updated Vital Signs BP 106/67 (BP Location: Left Arm)   Pulse 70   Temp 98.8 F (37.1 C) (Oral)   Resp 18   Ht 5\' 5"  (1.651 m)   Wt 108 kg (238 lb)   SpO2 99%   BMI 39.61 kg/m   Physical Exam  Constitutional: She is oriented to person, place, and time. She appears well-developed and well-nourished.  HENT:  Head: Normocephalic and atraumatic.  Eyes: Conjunctivae and EOM are normal.  Neck: Normal range of motion.  Cardiovascular: Normal rate and regular rhythm.  Pulmonary/Chest: Effort normal.  No stridor. No respiratory distress.  Abdominal: Soft. She exhibits no distension.  Musculoskeletal: Normal range of motion. She exhibits no edema or deformity.  Neurological: She is alert and oriented to person, place, and time.  Skin: Skin is warm and dry.  Nursing note and vitals reviewed.    ED Treatments / Results  Labs (all labs ordered are listed, but only abnormal results are displayed) Labs Reviewed  BASIC METABOLIC PANEL - Abnormal; Notable for the following components:      Result Value   Glucose, Bld 115 (*)    BUN 31 (*)    All other components within normal limits  CBC  TROPONIN I  TROPONIN I  HIV ANTIBODY (ROUTINE TESTING)    EKG EKG  Interpretation  Date/Time:  Friday May 15 2018 16:47:21 EDT Ventricular Rate:  74 PR Interval:    QRS Duration: 99 QT Interval:  427 QTC Calculation: 474 R Axis:   -16 Text Interpretation:  Sinus rhythm Borderline left axis deviation Nonspecific T abnormalities, lateral leads inverted T waves in I and aVL compared to 2005 Confirmed by Marily MemosMesner, Draden Cottingham [phone]([id number]) on 05/15/2018 5:49:04 PM   Radiology Dg Chest 2 View  Result Date: 05/15/2018 CLINICAL DATA:  Chest pain. EXAM: CHEST - 2 VIEW COMPARISON:  10/25/2014 FINDINGS: The cardiomediastinal silhouette is unchanged with persistent obscuration of the right heart border. Aortic atherosclerosis is noted. There is persistent elevation/anterior eventration of the right hemidiaphragm. Associated right basilar atelectasis is less than on the prior study. The left lung remains clear. No pleural effusion or pneumothorax is identified. No acute osseous abnormality is seen. IMPRESSION: Chronic elevation of the right hemidiaphragm with right basilar atelectasis. Electronically Signed   By: Sebastian AcheAllen  Grady M.D.   On: 05/15/2018 17:37    Procedures Procedures (including critical care time)  Medications Ordered in ED Medications  levothyroxine (SYNTHROID, LEVOTHROID) tablet 150 mcg (has no administration in time range)  nabumetone (RELAFEN) tablet 1,000 mg (has no administration in time range)  atenolol (TENORMIN) tablet 100 mg (has no administration in time range)  lisinopril (PRINIVIL,ZESTRIL) tablet 20 mg (has no administration in time range)  hydrochlorothiazide (HYDRODIURIL) tablet 25 mg (has no administration in time range)  DULoxetine (CYMBALTA) DR capsule 60 mg (has no administration in time range)  pantoprazole (PROTONIX) EC tablet 80 mg (has no administration in time range)  acetaminophen (TYLENOL) tablet 650 mg (has no administration in time range)  ondansetron (ZOFRAN) injection 4 mg (has no administration in time range)  enoxaparin (LOVENOX)  injection 40 mg (has no administration in time range)  aspirin EC tablet 325 mg (has no administration in time range)  gi cocktail (Maalox,Lidocaine,Donnatal) (has no administration in time range)  zolpidem (AMBIEN) tablet 5 mg (has no administration in time range)  nabumetone (RELAFEN) tablet 1,000 mg (1,000 mg Oral Given 05/16/18 0013)     Initial Impression / Assessment and Plan / ED Course  I have reviewed the triage vital signs and the nursing notes.  Pertinent labs & imaging results that were available during my care of the patient were reviewed by me and considered in my medical decision making (see chart for details).     Chest pain on exertion, better with rest. Multiple risk factors. HEART score of 6. Will admit for consideration of stress test.   Final Clinical Impressions(s) / ED Diagnoses   Final diagnoses:  Essential hypertension  Depression, unspecified depression type  Arthritis  Thyroid disease    ED Discharge Orders  None       Biff Rutigliano, Barbara Cower, MD 05/16/18 [phone]

## 2018-05-16 NOTE — Progress Notes (Signed)
Pt discharged via wheelchair with NT 

## 2018-05-16 NOTE — Plan of Care (Signed)
  Problem: Education: Goal: Understanding of cardiac disease, CV risk reduction, and recovery process will improve Outcome: Completed/Met   Problem: Activity: Goal: Ability to tolerate increased activity will improve Outcome: Completed/Met   Problem: Cardiac: Goal: Ability to achieve and maintain adequate cardiovascular perfusion will improve Outcome: Completed/Met   Problem: Health Behavior/Discharge Planning: Goal: Ability to safely manage health-related needs after discharge will improve Outcome: Completed/Met   Problem: Education: Goal: Knowledge of General Education information will improve Outcome: Completed/Met   Problem: Clinical Measurements: Goal: Ability to maintain clinical measurements within normal limits will improve Outcome: Completed/Met Goal: Cardiovascular complication will be avoided Outcome: Completed/Met   Problem: Activity: Goal: Risk for activity intolerance will decrease Outcome: Completed/Met   Problem: Nutrition: Goal: Adequate nutrition will be maintained Outcome: Completed/Met   Problem: Coping: Goal: Level of anxiety will decrease Outcome: Completed/Met   Problem: Elimination: Goal: Will not experience complications related to bowel motility Outcome: Completed/Met Goal: Will not experience complications related to urinary retention Outcome: Completed/Met   Problem: Pain Managment: Goal: General experience of comfort will improve Outcome: Completed/Met   Problem: Safety: Goal: Ability to remain free from injury will improve Outcome: Completed/Met   Problem: Skin Integrity: Goal: Risk for impaired skin integrity will decrease Outcome: Completed/Met

## 2018-05-16 NOTE — Progress Notes (Signed)
Pt states she takes all medications before bedtime between 2200-0000 except Protonix and Relafen Called pharmacy to inform  Pt states she does not want atenolol, lisinopril, hydrodiuril, Cymbalta or Lovenox at this time

## 2018-06-11 DIAGNOSIS — Z6841 Body Mass Index (BMI) 40.0 and over, adult: Secondary | ICD-10-CM | POA: Diagnosis not present

## 2018-06-11 DIAGNOSIS — I209 Angina pectoris, unspecified: Secondary | ICD-10-CM | POA: Diagnosis not present

## 2018-06-12 ENCOUNTER — Telehealth: Payer: Self-pay | Admitting: Cardiology

## 2018-06-12 ENCOUNTER — Other Ambulatory Visit: Payer: Self-pay | Admitting: *Deleted

## 2018-06-12 ENCOUNTER — Other Ambulatory Visit: Payer: Medicare Other | Admitting: *Deleted

## 2018-06-12 ENCOUNTER — Encounter: Payer: Self-pay | Admitting: *Deleted

## 2018-06-12 ENCOUNTER — Telehealth: Payer: Self-pay | Admitting: *Deleted

## 2018-06-12 DIAGNOSIS — I10 Essential (primary) hypertension: Secondary | ICD-10-CM

## 2018-06-12 MED ORDER — METOPROLOL TARTRATE 25 MG PO TABS: 25.0000 mg | ORAL_TABLET | Freq: Once | ORAL | 0 refills | Status: DC

## 2018-06-12 NOTE — Telephone Encounter (Signed)
Pt presented for Cardiac CT-  We reviewed her hospital notes and order is in place but not yet arranged.  We arranged and gave info including BB.  Pt appreciated the assistance.  She will follow up with Dr. Anne FuSkains for results.

## 2018-06-12 NOTE — Telephone Encounter (Signed)
Pt walked in today with some confusion.  Pt received phone call to come to office today to see Nada BoozerLaura Ingold, NP.  Vernona RiegerLaura stated pt was supposed to be here yesterday, do not see anything scheduled. Wrong name was put in the computer.  Fransico MichaelBrittiany Simmons, PA put orders in for a CTA.  Today  instructions were given for upcoming procedure, lopressor script was given and pt is having a bmet today.

## 2018-06-13 LAB — BASIC METABOLIC PANEL
BUN/Creatinine Ratio: 28 (ref 12–28)
BUN: 30 mg/dL — ABNORMAL HIGH (ref 8–27)
CALCIUM: 9.9 mg/dL (ref 8.7–10.3)
CO2: 25 mmol/L (ref 20–29)
CREATININE: 1.09 mg/dL — AB (ref 0.57–1.00)
Chloride: 99 mmol/L (ref 96–106)
GFR calc Af Amer: 62 mL/min/{1.73_m2} (ref >59)
GFR calc non Af Amer: 53 mL/min/{1.73_m2} — ABNORMAL LOW (ref >59)
GLUCOSE: 91 mg/dL (ref 65–99)
POTASSIUM: 4.7 mmol/L (ref 3.5–5.2)
SODIUM: 141 mmol/L (ref 134–144)

## 2018-06-19 ENCOUNTER — Other Ambulatory Visit: Payer: Self-pay | Admitting: *Deleted

## 2018-06-19 DIAGNOSIS — R079 Chest pain, unspecified: Secondary | ICD-10-CM

## 2018-06-19 DIAGNOSIS — R0789 Other chest pain: Secondary | ICD-10-CM

## 2018-06-19 DIAGNOSIS — R072 Precordial pain: Secondary | ICD-10-CM

## 2018-07-02 ENCOUNTER — Ambulatory Visit (HOSPITAL_COMMUNITY)
Admission: RE | Admit: 2018-07-02 | Discharge: 2018-07-02 | Disposition: A | Discharge status: Home / Self Care | Payer: Medicare Other | Source: Ambulatory Visit | Attending: Cardiology | Admitting: Cardiology

## 2018-07-02 ENCOUNTER — Encounter (HOSPITAL_COMMUNITY): Payer: Self-pay

## 2018-07-02 DIAGNOSIS — R072 Precordial pain: Secondary | ICD-10-CM | POA: Diagnosis not present

## 2018-07-02 DIAGNOSIS — R079 Chest pain, unspecified: Secondary | ICD-10-CM | POA: Diagnosis not present

## 2018-07-02 DIAGNOSIS — R0789 Other chest pain: Secondary | ICD-10-CM | POA: Diagnosis not present

## 2018-07-02 MED ORDER — METOPROLOL TARTRATE 5 MG/5ML IV SOLN
INTRAVENOUS | Status: AC
  Administered 2018-07-02: 5 mg via INTRAVENOUS
  Filled 2018-07-02: qty 5

## 2018-07-02 MED ORDER — NITROGLYCERIN 0.4 MG SL SUBL
SUBLINGUAL_TABLET | SUBLINGUAL | Status: AC
  Administered 2018-07-02: 0.8 mg via SUBLINGUAL
  Filled 2018-07-02: qty 2

## 2018-07-02 MED ORDER — METOPROLOL TARTRATE 5 MG/5ML IV SOLN
5.0000 mg | Freq: Once | INTRAVENOUS | Status: AC
  Administered 2018-07-02: 5 mg via INTRAVENOUS
  Filled 2018-07-02: qty 5

## 2018-07-02 MED ORDER — IOPAMIDOL (ISOVUE-370) INJECTION 76%
100.0000 mL | Freq: Once | INTRAVENOUS | Status: AC | PRN
  Administered 2018-07-02: 100 mL via INTRAVENOUS

## 2018-07-02 MED ORDER — NITROGLYCERIN 0.4 MG SL SUBL
0.8000 mg | SUBLINGUAL_TABLET | Freq: Once | SUBLINGUAL | Status: AC
  Administered 2018-07-02: 0.8 mg via SUBLINGUAL
  Filled 2018-07-02: qty 25

## 2018-07-03 ENCOUNTER — Encounter: Payer: Self-pay | Admitting: Cardiology

## 2018-07-03 ENCOUNTER — Ambulatory Visit (INDEPENDENT_AMBULATORY_CARE_PROVIDER_SITE_OTHER): Payer: Medicare Other | Admitting: Cardiology

## 2018-07-03 VITALS — BP 138/80 | HR 59 | Ht 65.0 in | Wt 237.4 lb

## 2018-07-03 DIAGNOSIS — I10 Essential (primary) hypertension: Secondary | ICD-10-CM

## 2018-07-03 DIAGNOSIS — R079 Chest pain, unspecified: Secondary | ICD-10-CM

## 2018-07-03 NOTE — Patient Instructions (Signed)

## 2018-07-03 NOTE — Progress Notes (Signed)
Cardiology Office Note:    Date:  07/03/2018   ID:  Latoya [name], DOB Jul 04, 1951, MRN [id number]  PCP:  Shawnie Dapper, PA-C  Cardiologist:  No primary care provider on file.   Referring MD: Glade Lloyd, MD     History of Present Illness:    Latoya [name] is a 67 y.o. female here for discussion of CT scan of heart which was normal.  It did show mild aortic atherosclerosis as well as a moderate sized hiatal hernia.  Explained to her today in clinic.  Chest discomfort noncardiac.  Past Medical History:  Diagnosis Date  . Arthritis   . Depression   . Hypertension   . Thyroid disease     Past Surgical History:  Procedure Laterality Date  . cyst removed from neck    . KNEE SURGERY Bilateral     Current Medications: Current Meds  Medication Sig  . atenolol (TENORMIN) 100 MG tablet Take 1 tablet by mouth daily.   . DULoxetine (CYMBALTA) 60 MG capsule Take 60 mg by mouth daily.  . hydrochlorothiazide (HYDRODIURIL) 25 MG tablet Take 1 tablet by mouth daily.   Marland Kitchen levothyroxine (SYNTHROID, LEVOTHROID) 150 MCG tablet Take 150 mcg by mouth daily before breakfast.  . lisinopril (PRINIVIL,ZESTRIL) 20 MG tablet Take 20 mg by mouth daily.   . nabumetone (RELAFEN) 500 MG tablet Take 1,000 mg by mouth 2 (two) times daily.  . nitroGLYCERIN (NITROSTAT) 0.4 MG SL tablet Place 1 tablet (0.4 mg total) under the tongue every 5 (five) minutes as needed for chest pain.  Marland Kitchen omeprazole (PRILOSEC) 40 MG capsule Take 40 mg by mouth daily.     Allergies:   Sulfa antibiotics   Social History   Socioeconomic History  . Marital status: Divorced    Spouse name: Not on file  . Number of children: Not on file  . Years of education: Not on file  . Highest education level: Not on file  Occupational History  . Not on file  Social Needs  . Financial resource strain: Not on file  . Food insecurity:    Worry: Not on file    Inability: Not on file  . Transportation needs:    Medical: Not on  file    Non-medical: Not on file  Tobacco Use  . Smoking status: Never Smoker  . Smokeless tobacco: Never Used  Substance and Sexual Activity  . Alcohol use: Never    Frequency: Never  . Drug use: Never  . Sexual activity: Never  Lifestyle  . Physical activity:    Days per week: Not on file    Minutes per session: Not on file  . Stress: Not on file  Relationships  . Social connections:    Talks on phone: Not on file    Gets together: Not on file    Attends religious service: Not on file    Active member of club or organization: Not on file    Attends meetings of clubs or organizations: Not on file    Relationship status: Not on file  Other Topics Concern  . Not on file  Social History Narrative  . Not on file     Family History: The patient's family history is not on file.  No early family history of CAD.  ROS:   Please see the history of present illness.     All other systems reviewed and are negative.  EKGs/Labs/Other Studies Reviewed:    The following studies were reviewed  today:  Coronary CT-no coronary artery disease, no calcium. Aortic atherosclerosis. Moderate-sized hiatal hernia. Within the visualized portions of the thorax there are no suspicious appearing pulmonary nodules or masses, there is no acute consolidative airspace disease, no pleural effusions, no pneumothorax and no lymphadenopathy. Visualized portions of the upper abdomen are unremarkable. There are no aggressive appearing lytic or blastic lesions noted in the visualized portions of the skeleton. Hiatal hernia.  Moderate size noted.  IMPRESSION: 1.  Aortic Atherosclerosis (ICD10-I70.0). 2. Moderate-sized hiatal hernia.   EKG:  No new  Recent Labs: 05/15/2018: Hemoglobin 12.7; Platelets 244 06/12/2018: BUN 30; Creatinine, Ser 1.09; Potassium 4.7; Sodium 141  Recent Lipid Panel No results found for: CHOL, TRIG, HDL, CHOLHDL, VLDL, LDLCALC, LDLDIRECT  Physical Exam:    VS:  BP 138/80    Pulse (!) 59   Ht 5\' 5"  (1.651 m)   Wt 237 lb 6.4 oz (107.7 kg)   BMI 39.51 kg/m     Wt Readings from Last 3 Encounters:  07/03/18 237 lb 6.4 oz (107.7 kg)  05/16/18 230 lb 8 oz (104.6 kg)     GEN: Obese Well nourished, well developed in no acute distress HEENT: Normal NECK: No JVD; No carotid bruits LYMPHATICS: No lymphadenopathy CARDIAC: RRR, no murmurs, rubs, gallops RESPIRATORY:  Clear to auscultation without rales, wheezing or rhonchi  ABDOMEN: Soft, non-tender, non-distended MUSCULOSKELETAL:  No edema; No deformity  SKIN: Warm and dry NEUROLOGIC:  Alert and oriented x 3 PSYCHIATRIC:  Normal affect   ASSESSMENT:    1. Chest pain, unspecified type   2. Essential hypertension    PLAN:    In order of problems listed above:  Atypical chest pain - CT scan very reassuring, no coronary calcium, no coronary artery disease.  Her heart is not the etiology for any of her discomfort.  Thankfully. -We discussed other potential etiologies including her moderate sized hiatal hernia.  One may consider future referral to gastroenterology.  Dietary modifications, exercise, blood pressure control important. - Interestingly, she states that nitroglycerin can help ease the discomfort.  This may point to etiology of esophageal spasm associated with her hiatal hernia.  One can consider dietary modifications if this does not work, consider calcium channel blocker or isosorbide long-term.  Aortic atherosclerosis -Mild.  Dietary prevention.  Could consider low-dose statin therapy in the future.  Obesity - Continue to encourage weight loss.   Medication Adjustments/Labs and Tests Ordered: Current medicines are reviewed at length with the patient today.  Concerns regarding medicines are outlined above.  No orders of the defined types were placed in this encounter.  No orders of the defined types were placed in this encounter.   Patient Instructions  Medication Instructions:   Your  physician recommends that you continue on your current medications as directed. Please refer to the Current Medication list given to you today.]  If you need a refill on your cardiac medications before your next appointment, please call your pharmacy.  Labwork: NONE ORDERED  TODAY     Testing/Procedures:  NONE ORDERED  TODAY    Follow-Up: CONTACT [facility] 336 [phone] AS NEEDED FOR  ANY CARDIAC RELATED SYMPTOMS     Any Other Special Instructions Will Be Listed Below (If Applicable).  Signed, Donato Schultz, MD  07/03/2018 10:11 AM    [facility]

## 2019-03-04 DIAGNOSIS — E063 Autoimmune thyroiditis: Secondary | ICD-10-CM | POA: Diagnosis not present

## 2019-05-21 DIAGNOSIS — R079 Chest pain, unspecified: Secondary | ICD-10-CM | POA: Diagnosis not present

## 2019-05-21 DIAGNOSIS — K224 Dyskinesia of esophagus: Secondary | ICD-10-CM | POA: Diagnosis not present

## 2019-05-21 DIAGNOSIS — K449 Diaphragmatic hernia without obstruction or gangrene: Secondary | ICD-10-CM | POA: Diagnosis not present

## 2020-03-16 DIAGNOSIS — I10 Essential (primary) hypertension: Secondary | ICD-10-CM | POA: Diagnosis not present

## 2020-03-16 DIAGNOSIS — E063 Autoimmune thyroiditis: Secondary | ICD-10-CM | POA: Diagnosis not present

## 2020-03-16 DIAGNOSIS — E785 Hyperlipidemia, unspecified: Secondary | ICD-10-CM | POA: Diagnosis not present

## 2020-03-16 DIAGNOSIS — Z1389 Encounter for screening for other disorder: Secondary | ICD-10-CM | POA: Diagnosis not present

## 2020-03-16 DIAGNOSIS — Z6841 Body Mass Index (BMI) 40.0 and over, adult: Secondary | ICD-10-CM | POA: Diagnosis not present

## 2020-03-16 DIAGNOSIS — G8929 Other chronic pain: Secondary | ICD-10-CM | POA: Diagnosis not present

## 2020-03-16 DIAGNOSIS — E039 Hypothyroidism, unspecified: Secondary | ICD-10-CM | POA: Diagnosis not present

## 2020-12-04 DIAGNOSIS — M1991 Primary osteoarthritis, unspecified site: Secondary | ICD-10-CM | POA: Diagnosis not present

## 2020-12-04 DIAGNOSIS — I10 Essential (primary) hypertension: Secondary | ICD-10-CM | POA: Diagnosis not present

## 2021-05-11 DIAGNOSIS — L80 Vitiligo: Secondary | ICD-10-CM | POA: Diagnosis not present

## 2021-05-11 DIAGNOSIS — E039 Hypothyroidism, unspecified: Secondary | ICD-10-CM | POA: Diagnosis not present

## 2021-05-11 DIAGNOSIS — Z6841 Body Mass Index (BMI) 40.0 and over, adult: Secondary | ICD-10-CM | POA: Diagnosis not present

## 2021-05-11 DIAGNOSIS — G8929 Other chronic pain: Secondary | ICD-10-CM | POA: Diagnosis not present

## 2021-05-11 DIAGNOSIS — E785 Hyperlipidemia, unspecified: Secondary | ICD-10-CM | POA: Diagnosis not present

## 2021-05-11 DIAGNOSIS — Z1331 Encounter for screening for depression: Secondary | ICD-10-CM | POA: Diagnosis not present

## 2021-05-11 DIAGNOSIS — I10 Essential (primary) hypertension: Secondary | ICD-10-CM | POA: Diagnosis not present

## 2021-07-25 DIAGNOSIS — U099 Post covid-19 condition, unspecified: Secondary | ICD-10-CM | POA: Diagnosis not present

## 2021-09-20 DIAGNOSIS — Z0000 Encounter for general adult medical examination without abnormal findings: Secondary | ICD-10-CM | POA: Diagnosis not present

## 2021-09-20 DIAGNOSIS — M1991 Primary osteoarthritis, unspecified site: Secondary | ICD-10-CM | POA: Diagnosis not present

## 2021-09-20 DIAGNOSIS — E785 Hyperlipidemia, unspecified: Secondary | ICD-10-CM | POA: Diagnosis not present

## 2021-09-20 DIAGNOSIS — Z1331 Encounter for screening for depression: Secondary | ICD-10-CM | POA: Diagnosis not present

## 2021-09-20 DIAGNOSIS — Z6841 Body Mass Index (BMI) 40.0 and over, adult: Secondary | ICD-10-CM | POA: Diagnosis not present

## 2021-09-20 DIAGNOSIS — G8929 Other chronic pain: Secondary | ICD-10-CM | POA: Diagnosis not present

## 2021-09-20 DIAGNOSIS — I10 Essential (primary) hypertension: Secondary | ICD-10-CM | POA: Diagnosis not present

## 2021-09-20 DIAGNOSIS — E039 Hypothyroidism, unspecified: Secondary | ICD-10-CM | POA: Diagnosis not present

## 2021-09-26 DIAGNOSIS — Z1389 Encounter for screening for other disorder: Secondary | ICD-10-CM | POA: Diagnosis not present

## 2021-09-26 DIAGNOSIS — Z6841 Body Mass Index (BMI) 40.0 and over, adult: Secondary | ICD-10-CM | POA: Diagnosis not present

## 2021-10-25 ENCOUNTER — Other Ambulatory Visit: Payer: Self-pay

## 2021-10-25 ENCOUNTER — Encounter: Payer: Self-pay | Admitting: Orthopedic Surgery

## 2021-10-25 ENCOUNTER — Ambulatory Visit: Payer: Medicare HMO

## 2021-10-25 ENCOUNTER — Ambulatory Visit (INDEPENDENT_AMBULATORY_CARE_PROVIDER_SITE_OTHER): Payer: Medicare HMO | Admitting: Orthopedic Surgery

## 2021-10-25 VITALS — BP 124/75 | HR 82 | Ht 64.0 in | Wt 235.0 lb

## 2021-10-25 DIAGNOSIS — M25551 Pain in right hip: Secondary | ICD-10-CM

## 2021-10-25 DIAGNOSIS — M1611 Unilateral primary osteoarthritis, right hip: Secondary | ICD-10-CM

## 2021-10-25 DIAGNOSIS — G8929 Other chronic pain: Secondary | ICD-10-CM

## 2021-10-25 MED ORDER — HYDROCODONE-ACETAMINOPHEN 7.5-325 MG PO TABS: 1.0000 | ORAL_TABLET | Freq: Four times a day (QID) | ORAL | 0 refills | Status: AC | PRN

## 2021-10-25 NOTE — Progress Notes (Signed)
NEW PROBLEM//OFFICE VISIT   Chief Complaint  Patient presents with   Hip Pain    Right states pain since 2010 patient states she is disabled due to hip and knee pain states increased pain flared up in past 3 weeks    This is a 70 year old female with hypertension hypothyroidism presents with acute on chronic right hip pain.  The patient says she was evaluated in [place] in 2010 and was diagnosed with osteoarthritis of the right hip but was able to manage.  She comes in on this occasion with acute increased pain over the last 3 weeks requiring the use of a cane  She describes pain in the right gluteal region radiating into the right groin and down the right anterior thigh.  She had a brief course of steroids which initially seemed to help but symptoms then exacerbated again    ROS: Patient denies shortness of breath does take nitroglycerin for occasional chest pain has some tiredness from the thyroid disease  BP 124/75   Pulse 82   Ht 5\' 4"  (1.626 m)   Wt 235 lb (106.6 kg)   BMI 40.34 kg/m   Body mass index is 40.34 kg/m.  The patient meets the AMA guidelines for Morbid (severe) obesity with a BMI > 40.0 and I have recommended weight loss.   General appearance: Well-developed well-nourished no gross deformities  Cardiovascular normal pulse and perfusion, color increased rubor both feet no edema   Neurologically no sensation loss or deficits or pathologic reflexes  Psychological: Awake alert and oriented x3 mood and affect normal  Skin no lacerations or ulcerations no nodularity no palpable masses, no erythema or nodularity  Musculoskeletal:  Right hip Flexion 120 degrees mild pain Limited internal and external rotation with groin pain  No leg length discrepancy      Past Medical History:  Diagnosis Date   Arthritis    Depression    Hypertension    Thyroid disease     Past Surgical History:  Procedure Laterality Date   cyst removed from neck     KNEE  SURGERY Bilateral     History reviewed. No pertinent family history. Social History   Tobacco Use   Smoking status: Never   Smokeless tobacco: Never  Vaping Use   Vaping Use: Never used  Substance Use Topics   Alcohol use: Never   Drug use: Never    Allergies  Allergen Reactions   Sulfa Antibiotics     Current Meds  Medication Sig   atenolol (TENORMIN) 100 MG tablet Take 1 tablet by mouth daily.    DULoxetine (CYMBALTA) 60 MG capsule Take 60 mg by mouth daily.   hydrochlorothiazide (HYDRODIURIL) 25 MG tablet Take 1 tablet by mouth daily.    HYDROcodone-acetaminophen (NORCO) 7.5-325 MG tablet Take 1 tablet by mouth every 6 (six) hours as needed for up to 5 days for moderate pain.   levothyroxine (SYNTHROID, LEVOTHROID) 150 MCG tablet Take 150 mcg by mouth daily before breakfast.   lisinopril (PRINIVIL,ZESTRIL) 20 MG tablet Take 20 mg by mouth daily.    nabumetone (RELAFEN) 500 MG tablet Take 1,000 mg by mouth 2 (two) times daily.   nitroGLYCERIN (NITROSTAT) 0.4 MG SL tablet Place 1 tablet (0.4 mg total) under the tongue every 5 (five) minutes as needed for chest pain.   omeprazole (PRILOSEC) 40 MG capsule Take 40 mg by mouth daily.     MEDICAL DECISION MAKING  A.  Encounter Diagnoses  Name Primary?   Chronic  right hip pain    Arthritis of right hip Yes    B. DATA ANALYSED:   IMAGING: Interpretation of images: I have personally reviewed the images and my interpretation is internal images AP lateral right hip with pelvis: Both hips show degenerative arthritis with joint space narrowing but no significant deformity  Orders: PT  Outside records reviewed: None   C. MANAGEMENT   My recommendations are listed below  This patient is currently a poor candidate for hip replacement surgery because of her weight.  She has not had an optimal course of nonoperative treatment  Surprised that the degree of pain she is having based on the images despite there being  arthritis there I would not expect it to be asymmetric in terms of her symptoms per se  In any event nonoperative treatment is the way to go at this point, when she is more suitable for surgery perhaps around 200 pounds 210 pounds we can send her to a hip replacement specialist  REC WEIGHT LOSS   REFERRAL TO HIP SURGEON ONCE THE WEIGHT IS DOWN   PHYSICAL THERAPY   OPIOIDS FOR A WEEK THEN CONTINUE NSAIDS AND SUPPORTIVE GAIT   Meds ordered this encounter  Medications   HYDROcodone-acetaminophen (NORCO) 7.5-325 MG tablet    Sig: Take 1 tablet by mouth every 6 (six) hours as needed for up to 5 days for moderate pain.    Dispense:  20 tablet    Refill:  0     Fuller Canada, MD  10/25/2021 11:13 AM

## 2021-10-25 NOTE — Patient Instructions (Addendum)
Follow up with your primary care doctor for weight loss and medication for pain   Physical therapy has been ordered for you at [facility]. They should call you to schedule, [phone] is the phone number to call, if you want to call to schedule.

## 2021-11-06 ENCOUNTER — Ambulatory Visit (HOSPITAL_COMMUNITY): Payer: Medicare HMO | Attending: Orthopedic Surgery | Admitting: Physical Therapy

## 2021-11-06 ENCOUNTER — Other Ambulatory Visit: Payer: Self-pay

## 2021-11-06 DIAGNOSIS — R262 Difficulty in walking, not elsewhere classified: Secondary | ICD-10-CM | POA: Insufficient documentation

## 2021-11-06 DIAGNOSIS — M25551 Pain in right hip: Secondary | ICD-10-CM | POA: Insufficient documentation

## 2021-11-06 DIAGNOSIS — M545 Low back pain, unspecified: Secondary | ICD-10-CM | POA: Insufficient documentation

## 2021-11-06 DIAGNOSIS — G8929 Other chronic pain: Secondary | ICD-10-CM | POA: Insufficient documentation

## 2021-11-06 DIAGNOSIS — M1611 Unilateral primary osteoarthritis, right hip: Secondary | ICD-10-CM | POA: Diagnosis not present

## 2021-11-06 NOTE — Therapy (Addendum)
[facility] [address] [place], [place], [address] Phone: [phone]   Fax:  [phone]  Physical Therapy Evaluation  Patient Details  Name: Latoya [name] MRN: [id number] Date of Birth: [birth year] Referring Provider (PT): Fuller Canada   Encounter Date: 11/06/2021   PT End of Session - 11/06/21 1402     Visit Number 1    Number of Visits 12    Date for PT Re-Evaluation 12/18/21    Authorization Type [employer] medicare    PT Start Time 1318    PT Stop Time 1358    PT Time Calculation (min) 40 min    Activity Tolerance Patient limited by pain    Behavior During Therapy Anxious             Past Medical History:  Diagnosis Date   Arthritis    Depression    Hypertension    Thyroid disease     Past Surgical History:  Procedure Laterality Date   cyst removed from neck     KNEE SURGERY Bilateral     There were no vitals filed for this visit.    Subjective Assessment - 11/06/21 1314     Subjective Latoya [name] states that she has had right hip pain for the past four weeks.  She has increased pain with WB to the point where she is completing her housework sitting.  She is having to sleep in her recliner due to her pain and is waking up 5-7 x a night. She has started using a cane due to the pain she is having when she is walking and it is difficult walking or going up and down steps.    Pertinent History HTN, OA,    Limitations Lifting;Standing;House hold activities    How long can you sit comfortably? ok    How long can you stand comfortably? less than five minutes this is due to hips and knees    How long can you walk comfortably? less than five minutes    Patient Stated Goals to get back to where I was    Currently in Pain? Yes    Pain Score 4    worst: 12/10; best 0/10   Pain Location Hip    Pain Orientation Right    Pain Descriptors / Indicators Aching    Pain Type Acute pain    Pain Onset 1 to 4 weeks ago     Pain Frequency Intermittent    Aggravating Factors  WB    Pain Relieving Factors sitting    Effect of Pain on Daily Activities limits                [facility] PT Assessment - 11/06/21 0001       Assessment   Medical Diagnosis Rt Hip pain    Referring Provider (PT) Fuller Canada    Onset Date/Surgical Date 10/09/21    Next MD Visit not scheduled    Prior Therapy none      Precautions   Precautions None      Restrictions   Weight Bearing Restrictions No      Balance Screen   Has the patient fallen in the past 6 months No    Has the patient had a decrease in activity level because of a fear of falling?  Yes    Is the patient reluctant to leave their home because of a fear of falling?  No      Home Environment  Living Environment Private residence    Home Access Stairs to enter    Entrance Stairs-Number of Steps 2      Prior Function   Level of Independence Independent    Vocation Retired      IT consultant   Overall Cognitive Status Within Functional Limits for tasks assessed      Observation/Other Assessments   Focus on Therapeutic Outcomes (FOTO)  30; 70% affected      Functional Tests   Functional tests Single leg stance;Sit to Stand      Single Leg Stance   Comments unable on either LE      Sit to Stand   Comments all weight is on her LT; 3 in 30 seconds      ROM / Strength   AROM / PROM / Strength AROM;Strength      AROM   AROM Assessment Site Hip    Right/Left Hip Right    Right Hip Extension --   lacking 25 degrees from neutral,((unable to lay leg flat on bed)     Strength   Strength Assessment Site Hip;Knee;Ankle    Right/Left Hip Right;Left    Right Hip Flexion 2/5    Right Hip Extension 2/5    Right Hip ABduction 2/5    Left Hip Flexion 4/5    Left Hip Extension 5/5    Left Hip ABduction 3+/5    Right/Left Knee Right;Left    Right Knee Extension 4+/5    Left Knee Extension 5/5    Right/Left Ankle Right;Left    Right Ankle Dorsiflexion  5/5    Left Ankle Dorsiflexion 4/5      Flexibility   Soft Tissue Assessment /Muscle Length yes      Ambulation/Gait   Ambulation Distance (Feet) 40 Feet    Assistive device Straight cane    Gait Comments 55 seconds; pt worn out.                        Objective measurements completed on examination: See above findings.       [facility] Adult PT Treatment/Exercise - 11/06/21 0001       Exercises   Exercises Knee/Hip      Knee/Hip Exercises: Seated   Ball Squeeze x10    Abduction/Adduction  Both;10 reps      Knee/Hip Exercises: Supine   Bridges 5 reps   unable to clear buttock caused significant pain so stopped   Other Supine Knee/Hip Exercises bent knee raise x 5 B                       PT Short Term Goals - 11/06/21 1643       PT SHORT TERM GOAL #1   Title PT to be I in HEP to allow her pain to decrease to no greater than a 5/10    Time 3    Period Weeks    Status New    Target Date 11/27/21      PT SHORT TERM GOAL #2   Title PT hip and back motion to improve to allow pt to pick items up off the ground.    Time 3    Period Weeks    Status New    Target Date 11/27/21               PT Long Term Goals - 11/06/21 1645       PT LONG TERM GOAL #1  Title PT to be I in advanced HEP to allow pain to be no greater than a 3/10    Time 6    Period Weeks    Status New    Target Date 12/18/21      PT LONG TERM GOAL #2   Title PT to be able to walk for 10 minutes without having to rest    Time 6    Period Weeks    Status New    Target Date 12/18/21      PT LONG TERM GOAL #3   Title PT strength in her Rt hip to improve to at least 4+/5 to allow pt to go up and down 4 steps in a reciprocal manner.    Time 4    Period Weeks    Status New      PT LONG TERM GOAL #4   Title PT to be getting at least 6 hours of sleep per night    Time 6    Period Weeks    Status New                    Plan - 11/06/21 1403      Clinical Impression Statement Latoya [name] is a 70 yo female who states that she is on disability for her knees and her Lt hip.  However, four weeks ago it was her right hip that started causing her pain.  She states that there was no injury or trauma but the pain has caused her to start walking with a cane, sleeping in her recliner and has decresed her functional mobility significantly.  Evaluation demonastrates decreased strength, decreased ROM, decreased balance, decreased activity tolerance and increased pain.  Ms Seawright will benefit from skilled PT to address these issues and maximize her functional ability.    Personal Factors and Comorbidities Comorbidity 2;Fitness;Time since onset of injury/illness/exacerbation    Comorbidities OA,    Examination-Activity Limitations Bend;Carry;Lift;Locomotion Level;Stairs;Stand    Examination-Participation Restrictions Cleaning;Community Activity;Shop    Stability/Clinical Decision Making Evolving/Moderate complexity    Clinical Decision Making Moderate    Rehab Potential Good    PT Frequency 2x / week    PT Duration 6 weeks    PT Treatment/Interventions Patient/family education;Therapeutic exercise;Balance training    PT Next Visit Plan Pt is not very tolerant to exercises attempt HMP prior to exercising.  Progress hip ROM and strength of hip    PT Home Exercise Plan sitting hip ab/adduction, hip adduction isometric, attempted bent knee raise and bridge but they were to difficult for the patient.             Patient will benefit from skilled therapeutic intervention in order to improve the following deficits and impairments:  Decreased activity tolerance, Decreased balance, Decreased mobility, Decreased range of motion, Difficulty walking, Decreased strength, Obesity, Pain  Visit Diagnosis: Chronic bilateral low back pain without sciatica - Plan: PT plan of care cert/re-cert     Problem List Patient Active Problem List   Diagnosis Date Noted    Chest pain 05/15/2018   Hypertension    Depression    Arthritis    Hypothyroid    Virgina Organ, PT CLT [phone]  11/06/2021, 4:50 PM  [facility] [address] [place], [place], [address] Phone: [phone]   Fax:  [phone]  Name: TEONA VARGUS MRN: [id number] Date of Birth: 07-07-1951

## 2021-11-12 DIAGNOSIS — I10 Essential (primary) hypertension: Secondary | ICD-10-CM | POA: Diagnosis not present

## 2021-11-13 ENCOUNTER — Encounter (HOSPITAL_COMMUNITY): Payer: Medicare HMO | Admitting: Physical Therapy

## 2021-11-15 ENCOUNTER — Ambulatory Visit (HOSPITAL_COMMUNITY): Payer: Medicare HMO | Admitting: Physical Therapy

## 2021-11-22 DIAGNOSIS — E039 Hypothyroidism, unspecified: Secondary | ICD-10-CM | POA: Diagnosis not present

## 2021-11-28 ENCOUNTER — Ambulatory Visit (HOSPITAL_COMMUNITY): Payer: Medicare HMO | Attending: Orthopedic Surgery | Admitting: Physical Therapy

## 2021-11-28 ENCOUNTER — Telehealth (HOSPITAL_COMMUNITY): Payer: Self-pay | Admitting: Physical Therapy

## 2021-11-28 NOTE — Telephone Encounter (Signed)
Called patient about missed appt. Left VM and reminder of upcoming visit 12/03/21.   5:34 PM, 11/28/21 Georges Lynch PT DPT  Physical Therapist with [facility]  [phone]

## 2021-12-03 ENCOUNTER — Encounter (HOSPITAL_COMMUNITY): Payer: Medicare HMO | Admitting: Physical Therapy

## 2021-12-04 ENCOUNTER — Encounter (HOSPITAL_COMMUNITY): Payer: Self-pay | Admitting: Physical Therapy

## 2021-12-04 ENCOUNTER — Telehealth (HOSPITAL_COMMUNITY): Payer: Self-pay | Admitting: Physical Therapy

## 2021-12-04 NOTE — Therapy (Signed)
[facility] [place], [place], [address] Phone: [phone]   Fax:  [phone]  Patient Details  Name: Latoya [name] MRN: [id number] Date of Birth: 06-May-1951 Referring Provider:  No ref. provider found  Encounter Date: 12/04/2021 PHYSICAL THERAPY DISCHARGE SUMMARY  Visits from Start of Care: 1  Current functional level related to goals / functional outcomes: Unknown as pt did not return    Remaining deficits: Unknown as pt did not return   Education / Equipment: HEP   Patient agrees to discharge. Patient goals were not met. Patient is being discharged due to not returning since the last visit.  Rayetta Humphrey, PT CLT [phone]  12/04/2021, 3:59 PM  [facility] [address] [place], [place], [address] Phone: [phone]   Fax:  [phone]

## 2021-12-04 NOTE — Telephone Encounter (Signed)
Pt request to be d/c she is doing HEP at home and getting better

## 2021-12-05 ENCOUNTER — Ambulatory Visit (HOSPITAL_COMMUNITY): Payer: Medicare HMO | Admitting: Physical Therapy

## 2021-12-11 ENCOUNTER — Encounter (HOSPITAL_COMMUNITY): Payer: Medicare HMO | Admitting: Physical Therapy

## 2021-12-13 ENCOUNTER — Encounter (HOSPITAL_COMMUNITY): Payer: Medicare HMO | Admitting: Physical Therapy

## 2021-12-18 ENCOUNTER — Ambulatory Visit (HOSPITAL_COMMUNITY): Payer: Medicare HMO | Admitting: Physical Therapy

## 2021-12-20 ENCOUNTER — Encounter (HOSPITAL_COMMUNITY): Payer: Medicare HMO | Admitting: Physical Therapy

## 2021-12-25 ENCOUNTER — Encounter (HOSPITAL_COMMUNITY): Payer: Medicare HMO | Admitting: Physical Therapy

## 2021-12-27 ENCOUNTER — Encounter (HOSPITAL_COMMUNITY): Payer: Medicare HMO | Admitting: Physical Therapy

## 2022-01-09 DIAGNOSIS — E039 Hypothyroidism, unspecified: Secondary | ICD-10-CM | POA: Diagnosis not present

## 2022-02-25 DIAGNOSIS — E039 Hypothyroidism, unspecified: Secondary | ICD-10-CM | POA: Diagnosis not present

## 2022-08-19 DIAGNOSIS — M1991 Primary osteoarthritis, unspecified site: Secondary | ICD-10-CM | POA: Diagnosis not present

## 2022-08-19 DIAGNOSIS — G8929 Other chronic pain: Secondary | ICD-10-CM | POA: Diagnosis not present

## 2022-08-19 DIAGNOSIS — I10 Essential (primary) hypertension: Secondary | ICD-10-CM | POA: Diagnosis not present

## 2022-08-19 DIAGNOSIS — Z6841 Body Mass Index (BMI) 40.0 and over, adult: Secondary | ICD-10-CM | POA: Diagnosis not present

## 2022-08-19 DIAGNOSIS — E785 Hyperlipidemia, unspecified: Secondary | ICD-10-CM | POA: Diagnosis not present

## 2023-01-16 DIAGNOSIS — M1991 Primary osteoarthritis, unspecified site: Secondary | ICD-10-CM | POA: Diagnosis not present

## 2023-01-16 DIAGNOSIS — G8929 Other chronic pain: Secondary | ICD-10-CM | POA: Diagnosis not present

## 2023-01-16 DIAGNOSIS — R21 Rash and other nonspecific skin eruption: Secondary | ICD-10-CM | POA: Diagnosis not present

## 2023-01-16 DIAGNOSIS — Z791 Long term (current) use of non-steroidal anti-inflammatories (NSAID): Secondary | ICD-10-CM | POA: Diagnosis not present

## 2023-01-23 ENCOUNTER — Encounter: Payer: Self-pay | Admitting: Radiology

## 2023-12-22 DIAGNOSIS — G8929 Other chronic pain: Secondary | ICD-10-CM | POA: Diagnosis not present

## 2023-12-22 DIAGNOSIS — E039 Hypothyroidism, unspecified: Secondary | ICD-10-CM | POA: Diagnosis not present

## 2023-12-22 DIAGNOSIS — M1991 Primary osteoarthritis, unspecified site: Secondary | ICD-10-CM | POA: Diagnosis not present

## 2023-12-22 DIAGNOSIS — Z791 Long term (current) use of non-steroidal anti-inflammatories (NSAID): Secondary | ICD-10-CM | POA: Diagnosis not present

## 2023-12-22 DIAGNOSIS — Z6841 Body Mass Index (BMI) 40.0 and over, adult: Secondary | ICD-10-CM | POA: Diagnosis not present

## 2023-12-22 DIAGNOSIS — I10 Essential (primary) hypertension: Secondary | ICD-10-CM | POA: Diagnosis not present

## 2023-12-22 DIAGNOSIS — E785 Hyperlipidemia, unspecified: Secondary | ICD-10-CM | POA: Diagnosis not present

## 2023-12-22 DIAGNOSIS — Z0001 Encounter for general adult medical examination with abnormal findings: Secondary | ICD-10-CM | POA: Diagnosis not present

## 2024-09-07 ENCOUNTER — Telehealth: Payer: Self-pay

## 2024-09-07 NOTE — Telephone Encounter (Unsigned)
 Copied from CRM [id number]. Topic: Clinical - Medication Refill >> Sep 07, 2024  1:56 PM Olam RAMAN wrote: Medication: DULoxetine  (CYMBALTA ) 60 MG capsule  Has the patient contacted their pharmacy? No (Agent: If no, request that the patient contact the pharmacy for the refill. If patient does not wish to contact the pharmacy document the reason why and proceed with request.) (Agent: If yes, when and what did the pharmacy advise?)  This is the patient's preferred pharmacy:  [facility] - [place], [place] - [address] [place] [address] Phone: [phone] Fax: [phone]  Is this the correct pharmacy for this prescription? Yes If no, delete pharmacy and type the correct one.   Has the prescription been filled recently? Yes  Is the patient out of the medication? No  Has the patient been seen for an appointment in the last year OR does the patient have an upcoming appointment? No  Can we respond through MyChart? No  Agent: Please be advised that Rx refills may take up to 3 business days. We ask that you follow-up with your pharmacy.

## 2024-09-08 ENCOUNTER — Other Ambulatory Visit: Payer: Self-pay

## 2024-09-08 DIAGNOSIS — F32A Depression, unspecified: Secondary | ICD-10-CM

## 2024-09-08 MED ORDER — DULOXETINE HCL 60 MG PO CPEP: 60.0000 mg | ORAL_CAPSULE | Freq: Every day | ORAL | 3 refills | Status: AC

## 2024-09-08 NOTE — Telephone Encounter (Signed)
 Cymbalta  refill sent to [facility]

## 2024-09-08 NOTE — Telephone Encounter (Signed)
 Pt advised.

## 2024-09-22 ENCOUNTER — Other Ambulatory Visit: Payer: Self-pay

## 2024-09-22 MED ORDER — OMEPRAZOLE 40 MG PO CPDR: 40.0000 mg | DELAYED_RELEASE_CAPSULE | Freq: Every day | ORAL | 1 refills | Status: AC

## 2024-09-22 MED ORDER — NITROGLYCERIN 0.4 MG SL SUBL: 0.4000 mg | SUBLINGUAL_TABLET | SUBLINGUAL | 1 refills | Status: AC | PRN

## 2024-09-22 MED ORDER — ATENOLOL 100 MG PO TABS: 100.0000 mg | ORAL_TABLET | Freq: Every day | ORAL | 1 refills | Status: AC

## 2024-09-22 NOTE — Telephone Encounter (Signed)
 Copied from CRM #[id number]. Topic: Clinical - Medication Refill >> Sep 22, 2024  1:20 PM Thliyah D wrote: Medication: omeprazole (PRILOSEC) 40 MG capsule atenolol  (TENORMIN ) 100 MG tablet nitroGLYCERIN  (NITROSTAT ) 0.4 MG SL tablet  Has the patient contacted their pharmacy? No (Agent: If no, request that the patient contact the pharmacy for the refill. If patient does not wish to contact the pharmacy document the reason why and proceed with request.) (Agent: If yes, when and what did the pharmacy advise?)  This is the patient's preferred pharmacy:  [facility] - [place], [place] - [address] [place] [address] Phone: [phone] Fax: [phone]  Is this the correct pharmacy for this prescription? Yes If no, delete pharmacy and type the correct one.   Has the prescription been filled recently? No  Is the patient out of the medication? Yes  Has the patient been seen for an appointment in the last year OR does the patient have an upcoming appointment? Yes  Can we respond through MyChart? No  Agent: Please be advised that Rx refills may take up to 3 business days. We ask that you follow-up with your pharmacy.

## 2024-10-15 ENCOUNTER — Other Ambulatory Visit: Payer: Self-pay

## 2024-10-15 MED ORDER — NABUMETONE 500 MG PO TABS: 1000.0000 mg | ORAL_TABLET | Freq: Two times a day (BID) | ORAL | 2 refills | Status: AC

## 2024-10-15 NOTE — Telephone Encounter (Signed)
 Copied from CRM [id number]. Topic: Clinical - Medication Refill >> Oct 15, 2024 11:35 AM Larissa S wrote: Medication: nabumetone  (RELAFEN ) 500 MG tablet  Has the patient contacted their pharmacy? Yes (Agent: If no, request that the patient contact the pharmacy for the refill. If patient does not wish to contact the pharmacy document the reason why and proceed with request.) (Agent: If yes, when and what did the pharmacy advise?)  This is the patient's preferred pharmacy:  [facility] - [place], [place] - [address] [place] [address] Phone: [phone] Fax: [phone]  Is this the correct pharmacy for this prescription? No If no, delete pharmacy and type the correct one.   Has the prescription been filled recently? No  Is the patient out of the medication? Yes  Has the patient been seen for an appointment in the last year OR does the patient have an upcoming appointment? Yes  Can we respond through MyChart? No  Agent: Please be advised that Rx refills may take up to 3 business days. We ask that you follow-up with your pharmacy.

## 2024-10-27 ENCOUNTER — Telehealth: Payer: Self-pay

## 2024-10-27 NOTE — Telephone Encounter (Signed)
 Copied from CRM [id number]. Topic: Clinical - Prescription Issue >> Oct 27, 2024  1:07 PM Wess RAMAN wrote: Reason for CRM: Patient states the directions are incorrect for DULoxetine  (CYMBALTA ) 60 MG capsule. It should be 2 capsules per day and she is already out of the medication due to it saying one capsule per day and a 30 day supply.  Callback #: [phone]  Pharmacy: [facility] [place], [place] - [address] [place] [address] Phone: [phone] Fax: [phone] Hours: Not open 24 hours

## 2024-11-11 ENCOUNTER — Ambulatory Visit: Payer: Self-pay

## 2024-11-11 ENCOUNTER — Other Ambulatory Visit: Payer: Self-pay

## 2024-11-11 MED ORDER — HYDROCHLOROTHIAZIDE 25 MG PO TABS: 25.0000 mg | ORAL_TABLET | Freq: Every day | ORAL | 2 refills | Status: AC

## 2024-11-11 NOTE — Telephone Encounter (Signed)
 Copied from CRM [id number]. Topic: Clinical - Medication Refill >> Nov 11, 2024  1:53 PM Berwyn MATSU wrote: Medication: hydrochlorothiazide  (HYDRODIURIL ) 25 MG tablet   Has the patient contacted their pharmacy? Yes (Agent: If no, request that the patient contact the pharmacy for the refill. If patient does not wish to contact the pharmacy document the reason why and proceed with request.) (Agent: If yes, when and what did the pharmacy advise?)  This is the patient's preferred pharmacy:  [facility] - [place], [place] - [address] [place] [address] Phone: [phone] Fax: [phone]  Is this the correct pharmacy for this prescription? Yes If no, delete pharmacy and type the correct one.   Has the prescription been filled recently? Yes  Is the patient out of the medication? Yes  Has the patient been seen for an appointment in the last year OR does the patient have an upcoming appointment? Yes  Can we respond through MyChart? No  Agent: Please be advised that Rx refills may take up to 3 business days. We ask that you follow-up with your pharmacy.

## 2024-11-11 NOTE — Telephone Encounter (Signed)
 FYI Only or Action Required?: Action required by provider: medication refill request.  Patient was last seen in primary care on n/a.  Called Nurse Triage reporting Shortness of Breath.  Symptoms began several days ago.  Interventions attempted: Nothing.  Symptoms are: gradually worsening.  Triage Disposition: See HCP Within 4 Hours (Or PCP Triage)  Patient/caregiver understands and will follow disposition?: No, wishes to speak with PCP      Copied from CRM #[id number]. Topic: Clinical - Red Word Triage >> Nov 11, 2024  1:56 PM Berwyn MATSU wrote: Red Word that prompted transfer to Nurse Triage: shortness of breathe and retaining fluid. Reason for Disposition  [1] MILD difficulty breathing (e.g., minimal/no SOB at rest, SOB with walking, pulse < 100) AND [2] NEW-onset or WORSE than normal  Answer Assessment - Initial Assessment Questions Pt doesn't have a new patient appt until February. She stated off conversation saying she was upset that Dr. Bevely dropped her cymbalta  without even seeing her. Rn advised it was put in from the request as 60mg  so that seems to be a miscommunication, however typically medications are sent in for patients they haven't seen. She states she has been refilling her medications since October and now needs a refill on her hydrochlorothiazide . She states that she has been without it for more than a week. RN advised pt if does not get refilled today, she needs to go to the UC. She states she is having more shortness of breath with exertion than normal. Denies any swelling or weight gain.     1. RESPIRATORY STATUS: Describe your breathing? (e.g., wheezing, shortness of breath, unable to speak, severe coughing)      Shortness of breath 2. ONSET: When did this breathing problem begin?      Within the last week 3. PATTERN Does the difficult breathing come and go, or has it been constant since it started?      With exertion 4. SEVERITY: How bad is your  breathing? (e.g., mild, moderate, severe)      mild 5. RECURRENT SYMPTOM: Have you had difficulty breathing before? If Yes, ask: When was the last time? and What happened that time?      Yes, states she needs her water pill 6. CAUSE: What do you think is causing the breathing problem?      Been without water pill 7. OTHER SYMPTOMS: Do you have any other symptoms? (e.g., chest pain, cough, dizziness, fever, runny nose)     denies  Protocols used: Breathing Difficulty-A-AH

## 2024-11-11 NOTE — Telephone Encounter (Signed)
 I sent in a refill of her hydrochlorothiazide  to her pharmacy.  I did not drop her Cymbalta , in fact it was refilled in October with refills.

## 2024-11-12 NOTE — Telephone Encounter (Signed)
 Pt advised with verbal understanding

## 2024-11-30 ENCOUNTER — Other Ambulatory Visit: Payer: Self-pay

## 2024-12-31 ENCOUNTER — Ambulatory Visit: Payer: Self-pay

## 2024-12-31 NOTE — Telephone Encounter (Signed)
 FYI Only or Action Required?: FYI only for provider: ED advised and refused.  Patient was last seen in primary care on N/A.  Called Nurse Triage reporting Shortness of Breath and Leg Swelling.  Symptoms began a week ago.  Interventions attempted: Other: elevates legs.  Symptoms are: gradually worsening.  Triage Disposition: Go to ED Now (Notify PCP)  Patient/caregiver understands and will follow disposition?: No                        Message from Tiffini S sent at 12/31/2024  1:12 PM EST  Reason for Triage: SOB and a lot of fluid in both feet/ legs   Reason for Disposition  Difficulty breathing  Answer Assessment - Initial Assessment Questions Intermittent chest pain feels different than the pain she has had for 3-4 years, x 1 week  SOB with exertion or lying flat x 1 week; feet and leg swelling up to knees bilateral x 3 days  RN stopped triage and advised ED. Patient refusing. Won't go to ED too stressed out right now. She wants RN to advise her if she can double up her diuretic instead of being seen by a provider or going to ED. Educated patient on purpose of nurse triage and scope of practice, that she should be seeking care from a provider. Patient states she may be willing to go to urgent care instead.  Protocols used: Chest Pain-A-AH

## 2025-01-11 ENCOUNTER — Ambulatory Visit: Payer: Self-pay
